# Patient Record
Sex: Female | Born: 1995 | Race: White | Hispanic: No | Marital: Married | Smoking: Never smoker
Health system: Southern US, Community
[De-identification: ages and names within clinical notes are randomized; demographics above are authoritative.]

## PROBLEM LIST (undated history)

## (undated) DIAGNOSIS — R079 Chest pain, unspecified: Secondary | ICD-10-CM

## (undated) DIAGNOSIS — R51 Headache: Secondary | ICD-10-CM

## (undated) DIAGNOSIS — R519 Headache, unspecified: Secondary | ICD-10-CM

## (undated) DIAGNOSIS — F32A Depression, unspecified: Secondary | ICD-10-CM

## (undated) DIAGNOSIS — F419 Anxiety disorder, unspecified: Secondary | ICD-10-CM

## (undated) DIAGNOSIS — F329 Major depressive disorder, single episode, unspecified: Secondary | ICD-10-CM

## (undated) DIAGNOSIS — Z8669 Personal history of other diseases of the nervous system and sense organs: Secondary | ICD-10-CM

## (undated) HISTORY — DX: Depression, unspecified: F32.A

## (undated) HISTORY — DX: Headache: R51

## (undated) HISTORY — DX: Chest pain, unspecified: R07.9

## (undated) HISTORY — DX: Anxiety disorder, unspecified: F41.9

## (undated) HISTORY — DX: Personal history of other diseases of the nervous system and sense organs: Z86.69

## (undated) HISTORY — DX: Major depressive disorder, single episode, unspecified: F32.9

## (undated) HISTORY — DX: Headache, unspecified: R51.9

---
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
## 2015-09-29 ENCOUNTER — Ambulatory Visit: Payer: Self-pay | Admitting: Internal Medicine

## 2015-11-01 ENCOUNTER — Ambulatory Visit (INDEPENDENT_AMBULATORY_CARE_PROVIDER_SITE_OTHER): Payer: BLUE CROSS/BLUE SHIELD | Admitting: Internal Medicine

## 2015-11-01 ENCOUNTER — Encounter: Payer: Self-pay | Admitting: Internal Medicine

## 2015-11-01 VITALS — BP 100/80 | HR 75 | Temp 98.4°F | Resp 18 | Ht <= 58 in | Wt 124.5 lb

## 2015-11-01 DIAGNOSIS — R51 Headache: Secondary | ICD-10-CM

## 2015-11-01 DIAGNOSIS — H9193 Unspecified hearing loss, bilateral: Secondary | ICD-10-CM

## 2015-11-01 DIAGNOSIS — F439 Reaction to severe stress, unspecified: Secondary | ICD-10-CM

## 2015-11-01 DIAGNOSIS — F329 Major depressive disorder, single episode, unspecified: Secondary | ICD-10-CM | POA: Diagnosis not present

## 2015-11-01 DIAGNOSIS — Z658 Other specified problems related to psychosocial circumstances: Secondary | ICD-10-CM

## 2015-11-01 DIAGNOSIS — Z0000 Encounter for general adult medical examination without abnormal findings: Secondary | ICD-10-CM

## 2015-11-01 DIAGNOSIS — R519 Headache, unspecified: Secondary | ICD-10-CM

## 2015-11-01 DIAGNOSIS — F32A Depression, unspecified: Secondary | ICD-10-CM

## 2015-11-01 DIAGNOSIS — R21 Rash and other nonspecific skin eruption: Secondary | ICD-10-CM

## 2015-11-01 MED ORDER — MAGNESIUM OXIDE 400 MG PO TABS: 400.0000 mg | ORAL_TABLET | Freq: Every day | ORAL | Status: DC

## 2015-11-01 NOTE — Progress Notes (Signed)
Pre-visit discussion using our clinic review tool. No additional management support is needed unless otherwise documented below in the visit note.  

## 2015-11-01 NOTE — Progress Notes (Signed)
Patient ID: Kelly Eaton, female   DOB: 08-27-1996, 20 y.o.   MRN: [id number]   Subjective:    Patient ID: Kelly Eaton, female    DOB: Dec 10, 1995, 19 y.o.   MRN: [id number]  HPI  Patient with past history of depression, anxiety and headaches.  She comes in today to establish care.  Previously had been seeing Dr Princess Bruins.  Reports a history of depression and anxiety.  Diagnosed in 2014.  Tool lexapro for 2 years.  Off for one year.  Feels she is doing ok off the medication.  She is in school.  Major - elementary education.  Has three semesters left.  She is planning to get married in 08/2016.  She is a Producer, television/film/video.  Describes a history of cluster headaches.  States brought on by stress.  These mostly involve - posterior bilateral head and move to front - behind her eyes.  She gets headaches approximately three days per week.  Mostly occur at night.  She also has a history of "migraines".  Describes these headaches as all over pressure.  May last all day.  Takes ibuprofen.  These may occur one time per month.  Is seen at [facility] in [place].  Had pap 2014.  Has nexplanon.  No bleeding.  Tries to stay active.  No chest pain or tightness with increased activity or exertion.  No sob.  Does report had back issues 2011.  Went to PT.  She also reports a rash over her back and shoulders.  No itching.  Started in 07/2015.  She has several moles she would like checked.  No nausea or vomiting.  Bowels stable.  Does report some decreased hearing - bilateral.     Past Medical History  Diagnosis Date  . Depression   . Frequent headaches     cluster headaches.  had MRI.   [name] Hx of migraines   . Anxiety    Past Surgical History  Procedure Laterality Date  . Mouth surgery  2013   Family History  Problem Relation Age of Onset  . Heart disease Maternal Grandfather   . Hypertension Maternal Grandfather   . Alcohol abuse Paternal Grandfather   . Hypertension Paternal Grandfather   . Early death  Paternal Grandfather    Social History   Social History  . Marital Status: Single    Spouse Name: N/A  . Number of Children: N/A  . Years of Education: N/A   Social History Main Topics  . Smoking status: Never Smoker   . Smokeless tobacco: Never Used  . Alcohol Use: 0.0 oz/week    0 Standard drinks or equivalent per week  . Drug Use: None  . Sexual Activity: Not Asked   Other Topics Concern  . None   Social History Narrative    Outpatient Encounter Prescriptions as of 11/01/2015  Medication Sig  . magnesium oxide (MAG-OX) 400 MG tablet Take 1 tablet (400 mg total) by mouth daily.   No facility-administered encounter medications on file as of 11/01/2015.    Review of Systems  Constitutional: Negative for fever and appetite change.  HENT: Negative for congestion and sinus pressure.   Eyes: Negative for pain and discharge.  Respiratory: Negative for cough, chest tightness and shortness of breath.   Cardiovascular: Negative for chest pain, palpitations and leg swelling.  Gastrointestinal: Negative for nausea, vomiting, abdominal pain and diarrhea.  Genitourinary: Negative for dysuria and difficulty urinating.  Musculoskeletal: Positive for back pain (has a history  of back pain.  not a significant issue for her now. ). Negative for joint swelling.  Skin: Positive for rash. Negative for color change.  Neurological: Positive for headaches. Negative for dizziness and light-headedness.  Hematological: Negative for adenopathy. Does not bruise/bleed easily.  Psychiatric/Behavioral: Negative for dysphoric mood and agitation.       Objective:    Physical Exam  Constitutional: She appears well-developed and well-nourished. No distress.  HENT:  Nose: Nose normal.  Mouth/Throat: Oropharynx is clear and moist.  Eyes: Conjunctivae are normal. Right eye exhibits no discharge. Left eye exhibits no discharge.  Neck: Neck supple. No thyromegaly present.  Cardiovascular: Normal rate  and regular rhythm.   Pulmonary/Chest: Breath sounds normal. No respiratory distress. She has no wheezes.  Abdominal: Soft. Bowel sounds are normal. There is no tenderness.  Musculoskeletal: She exhibits no edema or tenderness.  Lymphadenopathy:    She has no cervical adenopathy.  Skin: Skin is warm. No erythema.  No significant rash - shoulders.    Psychiatric: She has a normal mood and affect. Her behavior is normal.    BP 100/80 mmHg  Pulse 75  Temp(Src) 98.4 F (36.9 C) (Oral)  Resp 18  Ht  (1.473 m)  Wt 124 lb 8 oz (56.473 kg)  BMI 26.03 kg/m2  SpO2 96%  LMP 10/08/2015 (Exact Date) Wt Readings from Last 3 Encounters:  11/01/15 124 lb 8 oz (56.473 kg)     Lab Results  Component Value Date   WBC 6.3 11/15/2015   HGB 14.8 11/15/2015   HCT 43.2 11/15/2015   PLT 267.0 11/15/2015   GLUCOSE 89 11/15/2015   CHOL 146 11/15/2015   TRIG 79.0 11/15/2015   HDL 54.90 11/15/2015   LDLCALC 76 11/15/2015   ALT 15 11/15/2015   AST 18 11/15/2015   NA 140 11/15/2015   K 4.1 11/15/2015   CL 106 11/15/2015   CREATININE 1.02 11/15/2015   BUN 12 11/15/2015   CO2 25 11/15/2015   TSH 1.23 11/15/2015       Assessment & Plan:   Problem List Items Addressed This Visit    Depression    Has a history of depression.  Previously on lexapro.  Currently doing well.  Follow.  Does not feel needs any further intervention.        Headache - Primary    Has a history of cluster headaches and migraine headaches.  Migraines occur one time per month.  Had nexplanon for birth control.  Discussed sleep, stress, etc.  Feels she is handling stress relatively well.  Has had MRI previously.   States the cluster headaches may occur three days per week.  Will have neurology evaluate.  Start magoxide.  Follow.        Relevant Orders   Ambulatory referral to Neurology   Health care maintenance    Gets her pelvic and pap smears at [facility] in [place].        Hearing loss    States  she has noticed some change in both ears.  We discussed ENT evaluation.  She will notify me when agreeable.        Rash    Described a rash over her shoulders.  No significant rash noted today.  Has several moles she would like checked.  Will have dermatology evaluate.        Relevant Orders   Ambulatory referral to Dermatology   Stress    Increased stress.  Feels she is handling things  relatively well.  Follow.          I spent 45 minutes with the patient and more than 50% of the time was spent in consultation regarding the above.     Dale Segundo, MD

## 2015-11-06 ENCOUNTER — Encounter: Payer: Self-pay | Admitting: Internal Medicine

## 2015-11-15 ENCOUNTER — Other Ambulatory Visit (INDEPENDENT_AMBULATORY_CARE_PROVIDER_SITE_OTHER): Payer: BLUE CROSS/BLUE SHIELD

## 2015-11-15 ENCOUNTER — Telehealth: Payer: Self-pay | Admitting: *Deleted

## 2015-11-15 DIAGNOSIS — F32A Depression, unspecified: Secondary | ICD-10-CM

## 2015-11-15 DIAGNOSIS — Z658 Other specified problems related to psychosocial circumstances: Secondary | ICD-10-CM

## 2015-11-15 DIAGNOSIS — H9193 Unspecified hearing loss, bilateral: Secondary | ICD-10-CM

## 2015-11-15 DIAGNOSIS — R51 Headache: Secondary | ICD-10-CM

## 2015-11-15 DIAGNOSIS — Z13220 Encounter for screening for lipoid disorders: Secondary | ICD-10-CM

## 2015-11-15 DIAGNOSIS — F329 Major depressive disorder, single episode, unspecified: Secondary | ICD-10-CM | POA: Insufficient documentation

## 2015-11-15 DIAGNOSIS — R519 Headache, unspecified: Secondary | ICD-10-CM | POA: Insufficient documentation

## 2015-11-15 DIAGNOSIS — F439 Reaction to severe stress, unspecified: Secondary | ICD-10-CM | POA: Insufficient documentation

## 2015-11-15 DIAGNOSIS — H9190 Unspecified hearing loss, unspecified ear: Secondary | ICD-10-CM | POA: Insufficient documentation

## 2015-11-15 LAB — COMPREHENSIVE METABOLIC PANEL
ALT: 15 U/L (ref 0–35)
AST: 18 U/L (ref 0–37)
Albumin: 4.6 g/dL (ref 3.5–5.2)
Alkaline Phosphatase: 88 U/L (ref 39–117)
BILIRUBIN TOTAL: 0.7 mg/dL (ref 0.2–1.2)
BUN: 12 mg/dL (ref 6–23)
CHLORIDE: 106 meq/L (ref 96–112)
CO2: 25 mEq/L (ref 19–32)
Calcium: 9.7 mg/dL (ref 8.4–10.5)
Creatinine, Ser: 1.02 mg/dL (ref 0.40–1.20)
GFR: 72.8 mL/min (ref >60.00)
Glucose, Bld: 89 mg/dL (ref 70–99)
Potassium: 4.1 mEq/L (ref 3.5–5.1)
Sodium: 140 mEq/L (ref 135–145)
Total Protein: 7.6 g/dL (ref 6.0–8.3)

## 2015-11-15 LAB — CBC WITH DIFFERENTIAL/PLATELET
BASOS PCT: 0.4 % (ref 0.0–3.0)
Basophils Absolute: 0 10*3/uL (ref 0.0–0.1)
EOS PCT: 2.1 % (ref 0.0–5.0)
Eosinophils Absolute: 0.1 10*3/uL (ref 0.0–0.7)
HCT: 43.2 % (ref 36.0–46.0)
Hemoglobin: 14.8 g/dL (ref 12.0–15.0)
LYMPHS ABS: 2.1 10*3/uL (ref 0.7–4.0)
Lymphocytes Relative: 32.9 % (ref 12.0–46.0)
MCHC: 34.2 g/dL (ref 30.0–36.0)
MCV: 92.6 fl (ref 78.0–100.0)
Monocytes Absolute: 0.3 10*3/uL (ref 0.1–1.0)
Monocytes Relative: 4.7 % (ref 3.0–12.0)
NEUTROS ABS: 3.8 10*3/uL (ref 1.4–7.7)
NEUTROS PCT: 59.9 % (ref 43.0–77.0)
PLATELETS: 267 10*3/uL (ref 150.0–400.0)
RBC: 4.67 Mil/uL (ref 3.87–5.11)
RDW: 12.5 % (ref 11.5–14.6)
WBC: 6.3 10*3/uL (ref 4.5–10.5)

## 2015-11-15 LAB — LIPID PANEL
CHOLESTEROL: 146 mg/dL (ref 0–200)
HDL: 54.9 mg/dL (ref >39.00)
LDL Cholesterol: 76 mg/dL (ref 0–99)
NonHDL: 91.38
Total CHOL/HDL Ratio: 3
Triglycerides: 79 mg/dL (ref 0.0–149.0)
VLDL: 15.8 mg/dL (ref 0.0–40.0)

## 2015-11-15 LAB — TSH: TSH: 1.23 u[IU]/mL (ref 0.35–5.50)

## 2015-11-15 NOTE — Telephone Encounter (Signed)
Pt coming in for fasting labs this morning. Kelly Eaton needs orders placed. Pt has only been seen once (11/01/15)

## 2015-11-15 NOTE — Telephone Encounter (Signed)
Thanks

## 2015-11-15 NOTE — Telephone Encounter (Signed)
Orders placed for labs

## 2015-11-16 ENCOUNTER — Encounter: Payer: Self-pay | Admitting: *Deleted

## 2015-11-21 ENCOUNTER — Encounter: Payer: Self-pay | Admitting: Internal Medicine

## 2015-11-21 DIAGNOSIS — R21 Rash and other nonspecific skin eruption: Secondary | ICD-10-CM | POA: Insufficient documentation

## 2015-11-21 DIAGNOSIS — Z0000 Encounter for general adult medical examination without abnormal findings: Secondary | ICD-10-CM | POA: Insufficient documentation

## 2015-11-21 NOTE — Assessment & Plan Note (Signed)
Has a history of cluster headaches and migraine headaches.  Migraines occur one time per month.  Had nexplanon for birth control.  Discussed sleep, stress, etc.  Feels she is handling stress relatively well.  Has had MRI previously.   States the cluster headaches may occur three days per week.  Will have neurology evaluate.  Start magoxide.  Follow.

## 2015-11-21 NOTE — Assessment & Plan Note (Signed)
Described a rash over her shoulders.  No significant rash noted today.  Has several moles she would like checked.  Will have dermatology evaluate.

## 2015-11-21 NOTE — Assessment & Plan Note (Signed)
Has a history of depression.  Previously on lexapro.  Currently doing well.  Follow.  Does not feel needs any further intervention.

## 2015-11-21 NOTE — Assessment & Plan Note (Signed)
States she has noticed some change in both ears.  We discussed ENT evaluation.  She will notify me when agreeable.

## 2015-11-21 NOTE — Assessment & Plan Note (Signed)
Gets her pelvic and pap smears at [facility] in [place].

## 2015-11-21 NOTE — Assessment & Plan Note (Signed)
Increased stress.  Feels she is handling things relatively well.  Follow.   

## 2016-02-07 ENCOUNTER — Encounter: Payer: BLUE CROSS/BLUE SHIELD | Admitting: Internal Medicine

## 2016-03-13 ENCOUNTER — Ambulatory Visit (INDEPENDENT_AMBULATORY_CARE_PROVIDER_SITE_OTHER): Payer: BLUE CROSS/BLUE SHIELD | Admitting: Internal Medicine

## 2016-03-13 ENCOUNTER — Encounter: Payer: Self-pay | Admitting: Internal Medicine

## 2016-03-13 ENCOUNTER — Other Ambulatory Visit (HOSPITAL_COMMUNITY)
Admission: RE | Admit: 2016-03-13 | Discharge: 2016-03-13 | Disposition: A | Discharge status: Home / Self Care | Payer: BLUE CROSS/BLUE SHIELD | Source: Ambulatory Visit | Attending: Internal Medicine | Admitting: Internal Medicine

## 2016-03-13 VITALS — BP 100/70 | HR 59 | Temp 98.5°F | Resp 18 | Ht 59.0 in | Wt 130.5 lb

## 2016-03-13 DIAGNOSIS — Z0000 Encounter for general adult medical examination without abnormal findings: Secondary | ICD-10-CM

## 2016-03-13 DIAGNOSIS — Z01419 Encounter for gynecological examination (general) (routine) without abnormal findings: Secondary | ICD-10-CM | POA: Insufficient documentation

## 2016-03-13 DIAGNOSIS — R51 Headache: Secondary | ICD-10-CM | POA: Diagnosis not present

## 2016-03-13 DIAGNOSIS — F329 Major depressive disorder, single episode, unspecified: Secondary | ICD-10-CM

## 2016-03-13 DIAGNOSIS — F439 Reaction to severe stress, unspecified: Secondary | ICD-10-CM

## 2016-03-13 DIAGNOSIS — F32A Depression, unspecified: Secondary | ICD-10-CM

## 2016-03-13 DIAGNOSIS — Z124 Encounter for screening for malignant neoplasm of cervix: Secondary | ICD-10-CM

## 2016-03-13 DIAGNOSIS — Z658 Other specified problems related to psychosocial circumstances: Secondary | ICD-10-CM

## 2016-03-13 DIAGNOSIS — R519 Headache, unspecified: Secondary | ICD-10-CM

## 2016-03-13 MED ORDER — MUPIROCIN 2 % EX OINT: TOPICAL_OINTMENT | CUTANEOUS | Status: DC

## 2016-03-13 NOTE — Progress Notes (Signed)
Patient ID: Kelly Eaton, female   DOB: 07/25/1996, 20 y.o.   MRN: [id number]   Subjective:    Patient ID: Kelly Eaton, female    DOB: 01/26/1996, 20 y.o.   MRN: [id number]  HPI  Patient here for her physical exam.  Headaches better.  Saw neurology.  Started mangesium.  Off now.  She has decreased sugar intake.  Feels increased sugar aggravates headaches.  Breathing stable.  No acid reflux.  Has nexplanon.  Period - last 12/2015.  No nausea or vomiting.  Bowels stable.  Increased stress.  Feels overwhelmed.  Going to school.  Working two jobs.  Planning a wedding.  She is also concerned about not being able to focus and decreased concentration.  She was questioning ADHD.  Discussed psych referral.  She agreed.  No suicidal ideations.     Past Medical History  Diagnosis Date  . Depression   . Frequent headaches     cluster headaches.  had MRI.   [name]. Hx of migraines   . Anxiety    Past Surgical History  Procedure Laterality Date  . Mouth surgery  2013   Family History  Problem Relation Age of Onset  . Heart disease Maternal Grandfather   . Hypertension Maternal Grandfather   . Alcohol abuse Paternal Grandfather   . Hypertension Paternal Grandfather   . Early death Paternal Grandfather    Social History   Social History  . Marital Status: Single    Spouse Name: N/A  . Number of Children: N/A  . Years of Education: N/A   Social History Main Topics  . Smoking status: Never Smoker   . Smokeless tobacco: Never Used  . Alcohol Use: 0.0 oz/week    0 Standard drinks or equivalent per week  . Drug Use: None  . Sexual Activity: Not Asked   Other Topics Concern  . None   Social History Narrative    Outpatient Encounter Prescriptions as of 03/13/2016  Medication Sig  . [DISCONTINUED] magnesium oxide (MAG-OX) 400 MG tablet Take 1 tablet (400 mg total) by mouth daily.  . mupirocin ointment (BACTROBAN) 2 % Apply to affected area bid   No facility-administered encounter  medications on file as of 03/13/2016.    Review of Systems  Constitutional: Negative for fever and appetite change.  HENT: Negative for congestion and sinus pressure.   Eyes: Negative for pain and visual disturbance.  Respiratory: Negative for cough, chest tightness and shortness of breath.   Cardiovascular: Negative for chest pain, palpitations and leg swelling.  Gastrointestinal: Negative for nausea, vomiting, abdominal pain and diarrhea.  Genitourinary: Negative for dysuria and difficulty urinating.  Musculoskeletal: Negative for back pain and joint swelling.  Skin: Negative for color change and rash.  Neurological: Negative for dizziness.       Headaches improved.   Hematological: Negative for adenopathy. Does not bruise/bleed easily.  Psychiatric/Behavioral: Negative for dysphoric mood and agitation.       Objective:    Physical Exam  Constitutional: She is oriented to person, place, and time. She appears well-developed and well-nourished. No distress.  HENT:  Nose: Nose normal.  Mouth/Throat: Oropharynx is clear and moist.  Eyes: Right eye exhibits no discharge. Left eye exhibits no discharge. No scleral icterus.  Neck: Neck supple. No thyromegaly present.  Cardiovascular: Normal rate and regular rhythm.   Pulmonary/Chest: Breath sounds normal. No accessory muscle usage. No tachypnea. No respiratory distress. She has no decreased breath sounds. She has no wheezes. She has  no rhonchi. Right breast exhibits no inverted nipple, no mass, no nipple discharge and no tenderness (no axillary adenopathy). Left breast exhibits no inverted nipple, no mass, no nipple discharge and no tenderness (no axilarry adenopathy).  Abdominal: Soft. Bowel sounds are normal. There is no tenderness.  Genitourinary:  Normal external genitalia.  Vaginal vault without lesions.  Cervix identified.  Pap smear performed.  Could not appreciate any adnexal masses or tenderness.    Musculoskeletal: She exhibits  no edema or tenderness.  Lymphadenopathy:    She has no cervical adenopathy.  Neurological: She is alert and oriented to person, place, and time.  Skin: Skin is warm. No rash noted. No erythema.  Psychiatric: She has a normal mood and affect. Her behavior is normal.    BP 100/70 mmHg  Pulse 59  Temp(Src) 98.5 F (36.9 C) (Oral)  Resp 18  Ht  (1.499 m)  Wt 130 lb 8 oz (59.194 kg)  BMI 26.34 kg/m2  SpO2 98%  LMP 03/08/2016 (Exact Date) Wt Readings from Last 3 Encounters:  03/13/16 130 lb 8 oz (59.194 kg)  11/01/15 124 lb 8 oz (56.473 kg)     Lab Results  Component Value Date   WBC 6.3 11/15/2015   HGB 14.8 11/15/2015   HCT 43.2 11/15/2015   PLT 267.0 11/15/2015   GLUCOSE 89 11/15/2015   CHOL 146 11/15/2015   TRIG 79.0 11/15/2015   HDL 54.90 11/15/2015   LDLCALC 76 11/15/2015   ALT 15 11/15/2015   AST 18 11/15/2015   NA 140 11/15/2015   K 4.1 11/15/2015   CL 106 11/15/2015   CREATININE 1.02 11/15/2015   BUN 12 11/15/2015   CO2 25 11/15/2015   TSH 1.23 11/15/2015       Assessment & Plan:   Problem List Items Addressed This Visit    Depression    Has a history of depression.  Previously on lexapro.  Overwhelmed now.  No suicidal ideations.  Discussed with her today.  Desires not to start medication.  Was questioning ADHD.  Discussed psych referral.  She agrees.        Relevant Orders   Ambulatory referral to Psychiatry   Headache    Saw neurology.  Took magnesium.  Headaches better.  Off magnesium.  Decreased sugar intake in diet.  Follow.       Health care maintenance    Physical today 03/13/16.  PAP 03/13/16.        Stress    Increased stress as outlined.  See above.  Refer to psych.        Relevant Orders   Ambulatory referral to Psychiatry    Other Visit Diagnoses    Pap smear for cervical cancer screening    -  Primary    Relevant Orders    Cytology - PAP        Dale Providence, MD

## 2016-03-13 NOTE — Progress Notes (Signed)
Pre-visit discussion using our clinic review tool. No additional management support is needed unless otherwise documented below in the visit note.  

## 2016-03-14 ENCOUNTER — Encounter: Payer: Self-pay | Admitting: Internal Medicine

## 2016-03-14 LAB — CYTOLOGY - PAP

## 2016-03-14 NOTE — Assessment & Plan Note (Signed)
Has a history of depression.  Previously on lexapro.  Overwhelmed now.  No suicidal ideations.  Discussed with her today.  Desires not to start medication.  Was questioning ADHD.  Discussed psych referral.  She agrees.

## 2016-03-14 NOTE — Assessment & Plan Note (Signed)
Physical today 03/13/16.  PAP 03/13/16.

## 2016-03-14 NOTE — Assessment & Plan Note (Signed)
Saw neurology.  Took magnesium.  Headaches better.  Off magnesium.  Decreased sugar intake in diet.  Follow.

## 2016-03-14 NOTE — Assessment & Plan Note (Signed)
Increased stress as outlined.  See above.  Refer to psych.

## 2016-03-15 ENCOUNTER — Encounter: Payer: Self-pay | Admitting: *Deleted

## 2016-06-19 ENCOUNTER — Encounter: Payer: Self-pay | Admitting: Internal Medicine

## 2016-06-19 ENCOUNTER — Ambulatory Visit (INDEPENDENT_AMBULATORY_CARE_PROVIDER_SITE_OTHER): Payer: BLUE CROSS/BLUE SHIELD | Admitting: Internal Medicine

## 2016-06-19 VITALS — BP 102/64 | HR 56 | Temp 98.3°F | Ht 59.0 in | Wt 131.2 lb

## 2016-06-19 DIAGNOSIS — F329 Major depressive disorder, single episode, unspecified: Secondary | ICD-10-CM

## 2016-06-19 DIAGNOSIS — R110 Nausea: Secondary | ICD-10-CM | POA: Diagnosis not present

## 2016-06-19 DIAGNOSIS — Z658 Other specified problems related to psychosocial circumstances: Secondary | ICD-10-CM | POA: Diagnosis not present

## 2016-06-19 DIAGNOSIS — R109 Unspecified abdominal pain: Secondary | ICD-10-CM | POA: Diagnosis not present

## 2016-06-19 DIAGNOSIS — L989 Disorder of the skin and subcutaneous tissue, unspecified: Secondary | ICD-10-CM

## 2016-06-19 DIAGNOSIS — F439 Reaction to severe stress, unspecified: Secondary | ICD-10-CM

## 2016-06-19 DIAGNOSIS — Z975 Presence of (intrauterine) contraceptive device: Secondary | ICD-10-CM

## 2016-06-19 DIAGNOSIS — F32A Depression, unspecified: Secondary | ICD-10-CM

## 2016-06-19 LAB — CBC WITH DIFFERENTIAL/PLATELET
Basophils Absolute: 0 10*3/uL (ref 0.0–0.1)
Basophils Relative: 0.4 % (ref 0.0–3.0)
EOS ABS: 0.1 10*3/uL (ref 0.0–0.7)
EOS PCT: 1.2 % (ref 0.0–5.0)
HCT: 40.7 % (ref 36.0–46.0)
HEMOGLOBIN: 14.1 g/dL (ref 12.0–15.0)
LYMPHS ABS: 1.8 10*3/uL (ref 0.7–4.0)
Lymphocytes Relative: 23.8 % (ref 12.0–46.0)
MCHC: 34.5 g/dL (ref 30.0–36.0)
MCV: 93.8 fl (ref 78.0–100.0)
MONO ABS: 0.3 10*3/uL (ref 0.1–1.0)
Monocytes Relative: 4.6 % (ref 3.0–12.0)
NEUTROS PCT: 70 % (ref 43.0–77.0)
Neutro Abs: 5.3 10*3/uL (ref 1.4–7.7)
Platelets: 256 10*3/uL (ref 150.0–400.0)
RBC: 4.34 Mil/uL (ref 3.87–5.11)
RDW: 11.9 % (ref 11.5–15.5)
WBC: 7.5 10*3/uL (ref 4.0–10.5)

## 2016-06-19 LAB — HEPATIC FUNCTION PANEL
ALT: 13 U/L (ref 0–35)
AST: 16 U/L (ref 0–37)
Albumin: 4.2 g/dL (ref 3.5–5.2)
Alkaline Phosphatase: 76 U/L (ref 39–117)
BILIRUBIN DIRECT: 0.1 mg/dL (ref 0.0–0.3)
BILIRUBIN TOTAL: 0.7 mg/dL (ref 0.2–1.2)
Total Protein: 7.2 g/dL (ref 6.0–8.3)

## 2016-06-19 LAB — HCG, QUANTITATIVE, PREGNANCY: QUANTITATIVE HCG: 0.18 m[IU]/mL

## 2016-06-19 LAB — BASIC METABOLIC PANEL
BUN: 22 mg/dL (ref 6–23)
CO2: 27 meq/L (ref 19–32)
Calcium: 9.1 mg/dL (ref 8.4–10.5)
Chloride: 105 mEq/L (ref 96–112)
Creatinine, Ser: 1.01 mg/dL (ref 0.40–1.20)
GFR: 73.21 mL/min (ref >60.00)
GLUCOSE: 79 mg/dL (ref 70–99)
POTASSIUM: 3.9 meq/L (ref 3.5–5.1)
SODIUM: 139 meq/L (ref 135–145)

## 2016-06-19 NOTE — Progress Notes (Signed)
Pre visit review using our clinic review tool, if applicable. No additional management support is needed unless otherwise documented below in the visit note. 

## 2016-06-19 NOTE — Progress Notes (Signed)
Patient ID: Kelly Eaton, female   DOB: 04/04/1996, 20 y.o.   MRN: [id number]   Subjective:    Patient ID: Kelly Eaton, female    DOB: 01/22/1996, 20 y.o.   MRN: [id number]  HPI  Patient here for a scheduled follow up.  She reports stomach cramping - intermittent.  Present for one month. Some nausea in the morning.  Increased this am.  Waves of nausea intermittently.  Some associated light headedness.  No headache.  Eating and drinking.  Some intermittent spotting.  Has nexplanon.  Reports increased arm pain and wants nexplanon removed.   Is concerned may be pregnant.  Some increased stress related to this.    Past Medical History:  Diagnosis Date  . Anxiety   . Depression   . Frequent headaches    cluster headaches.  had MRI.   [name]. Hx of migraines    Past Surgical History:  Procedure Laterality Date  . MOUTH SURGERY  2013   Family History  Problem Relation Age of Onset  . Heart disease Maternal Grandfather   . Hypertension Maternal Grandfather   . Alcohol abuse Paternal Grandfather   . Hypertension Paternal Grandfather   . Early death Paternal Grandfather    Social History   Social History  . Marital status: Single    Spouse name: N/A  . Number of children: N/A  . Years of education: N/A   Social History Main Topics  . Smoking status: Never Smoker  . Smokeless tobacco: Never Used  . Alcohol use 0.0 oz/week  . Drug use: Unknown  . Sexual activity: Not Asked   Other Topics Concern  . None   Social History Narrative  . None    Outpatient Encounter Prescriptions as of 06/19/2016  Medication Sig  . mupirocin ointment (BACTROBAN) 2 % Apply to affected area bid  . sertraline (ZOLOFT) 50 MG tablet    No facility-administered encounter medications on file as of 06/19/2016.     Review of Systems  Constitutional: Negative for appetite change and unexpected weight change.  HENT: Negative for congestion and sinus pressure.   Respiratory: Negative for cough, chest  tightness and shortness of breath.   Cardiovascular: Negative for chest pain, palpitations and leg swelling.  Gastrointestinal: Positive for nausea. Negative for abdominal pain, diarrhea and vomiting.       Some cramping.    Genitourinary: Negative for difficulty urinating and dysuria.  Musculoskeletal: Negative for back pain and joint swelling.       Left arm pain as outlined.    Skin: Negative for color change and rash.  Neurological: Positive for dizziness. Negative for headaches.  Psychiatric/Behavioral: Negative for agitation and dysphoric mood.       Some increased stress with current symptoms and concern about pregnancy.         Objective:    Physical Exam  BP 102/64   Pulse (!) 56   Temp 98.3 F (36.8 C) (Oral)   Ht 4\' 11"  (1.499 m)   Wt 131 lb 3.2 oz (59.5 kg)   SpO2 98%   BMI 26.50 kg/m  Wt Readings from Last 3 Encounters:  06/19/16 131 lb 3.2 oz (59.5 kg)  03/13/16 130 lb 8 oz (59.2 kg)  11/01/15 124 lb 8 oz (56.5 kg)     Lab Results  Component Value Date   WBC 7.5 06/19/2016   HGB 14.1 06/19/2016   HCT 40.7 06/19/2016   PLT 256.0 06/19/2016   GLUCOSE 79 06/19/2016  CHOL 146 11/15/2015   TRIG 79.0 11/15/2015   HDL 54.90 11/15/2015   LDLCALC 76 11/15/2015   ALT 13 06/19/2016   AST 16 06/19/2016   NA 139 06/19/2016   K 3.9 06/19/2016   CL 105 06/19/2016   CREATININE 1.01 06/19/2016   BUN 22 06/19/2016   CO2 27 06/19/2016   TSH 1.23 11/15/2015       Assessment & Plan:   Problem List Items Addressed This Visit    Depression    Seeing psychiatry.  Is doing better.  On zoloft.        Relevant Medications   sertraline (ZOLOFT) 50 MG tablet   Nausea without vomiting    Persistent.  Worse in am.  Concern regarding pregnancy.  Check serum pregnancy test.  Eating.  No vomiting.  No significant abdominal pain.  Check labs.  Further w/up pending results.        Relevant Orders   Hepatic function panel (Completed)   B-HCG Quant (Completed)    Stress    Seeing Dr Maryruth Bun and a counselor Marcelino Duster).  On zoloft.  Doing better.  Increased stress with current symptoms.  Follow.         Other Visit Diagnoses    Abdominal cramping    -  Primary   Relevant Orders   CBC with Differential/Platelet (Completed)   Basic metabolic panel (Completed)   Nexplanon in place       reports some persistent arm pain.  wants removed.  refer to gyn for removal.  was previously placed by her pediatrician.     Relevant Orders   Ambulatory referral to Gynecology   Foot lesion       refer to dermatology for removal.     Relevant Orders   Ambulatory referral to Dermatology       Dale Leslie, MD'

## 2016-06-20 ENCOUNTER — Telehealth: Payer: Self-pay | Admitting: *Deleted

## 2016-06-20 NOTE — Telephone Encounter (Signed)
Patient has requested lab results  Pt contact [phone]

## 2016-06-20 NOTE — Telephone Encounter (Signed)
Patient was informed of results.  Patient understood and no questions, comments, or concerns at this time.  

## 2016-06-24 ENCOUNTER — Encounter: Payer: Self-pay | Admitting: Internal Medicine

## 2016-06-24 NOTE — Assessment & Plan Note (Signed)
Persistent.  Worse in am.  Concern regarding pregnancy.  Check serum pregnancy test.  Eating.  No vomiting.  No significant abdominal pain.  Check labs.  Further w/up pending results.

## 2016-06-24 NOTE — Assessment & Plan Note (Signed)
Seeing psychiatry.  Is doing better.  On zoloft.

## 2016-06-24 NOTE — Assessment & Plan Note (Signed)
Seeing Dr Maryruth BunKapur and a counselor Marcelino Duster(Michelle).  On zoloft.  Doing better.  Increased stress with current symptoms.  Follow.

## 2016-10-04 ENCOUNTER — Encounter: Payer: Self-pay | Admitting: Internal Medicine

## 2016-10-04 ENCOUNTER — Ambulatory Visit (INDEPENDENT_AMBULATORY_CARE_PROVIDER_SITE_OTHER): Payer: BLUE CROSS/BLUE SHIELD | Admitting: Internal Medicine

## 2016-10-04 DIAGNOSIS — R635 Abnormal weight gain: Secondary | ICD-10-CM

## 2016-10-04 DIAGNOSIS — F439 Reaction to severe stress, unspecified: Secondary | ICD-10-CM | POA: Diagnosis not present

## 2016-10-04 NOTE — Progress Notes (Signed)
Pre visit review using our clinic review tool, if applicable. No additional management support is needed unless otherwise documented below in the visit note. 

## 2016-10-04 NOTE — Progress Notes (Signed)
Patient ID: Kelly Eaton, female   DOB: 01/18/1996, 21 y.o.   MRN: [id number]   Subjective:    Patient ID: Kelly Eaton, female    DOB: 03/29/1996, 21 y.o.   MRN: [id number]  HPI  Patient here for a scheduled follow up.  She is concerned regarding weight gain.  Discussed diet and exercise.  Information given.  She prefers to meet with nutritionist. Stays active.  Breathing stable.  No acid reflux.  No abdominal pain or cramping.  Bowels stable.  LMP 10/01/16-10/03/16.  Had nexplanon removed.  Not on any ocp's now.  Husband is in [employer].  Will not see him until April.  She prefers to remain off ocp's for now.     Past Medical History:  Diagnosis Date  . Anxiety   . Depression   . Frequent headaches    cluster headaches.  had MRI.   [name]. Hx of migraines    Past Surgical History:  Procedure Laterality Date  . MOUTH SURGERY  2013   Family History  Problem Relation Age of Onset  . Heart disease Maternal Grandfather   . Hypertension Maternal Grandfather   . Alcohol abuse Paternal Grandfather   . Hypertension Paternal Grandfather   . Early death Paternal Grandfather    Social History   Social History  . Marital status: Single    Spouse name: N/A  . Number of children: N/A  . Years of education: N/A   Social History Main Topics  . Smoking status: Never Smoker  . Smokeless tobacco: Never Used  . Alcohol use 0.0 oz/week  . Drug use: Unknown  . Sexual activity: Not Asked   Other Topics Concern  . None   Social History Narrative  . None    Outpatient Encounter Prescriptions as of 10/04/2016  Medication Sig  . sertraline (ZOLOFT) 50 MG tablet   . [DISCONTINUED] mupirocin ointment (BACTROBAN) 2 % Apply to affected area bid   No facility-administered encounter medications on file as of 10/04/2016.     Review of Systems  Constitutional: Negative for appetite change.       Concerned regarding weight gain.    HENT: Negative for congestion and sinus pressure.   Respiratory:  Negative for cough, chest tightness and shortness of breath.   Cardiovascular: Negative for chest pain, palpitations and leg swelling.  Gastrointestinal: Negative for abdominal pain, diarrhea, nausea and vomiting.  Genitourinary: Negative for difficulty urinating and dysuria.  Musculoskeletal: Negative for back pain and joint swelling.  Skin: Negative for color change and rash.  Neurological: Negative for dizziness, light-headedness and headaches.  Psychiatric/Behavioral: Negative for agitation and dysphoric mood.       Objective:    Physical Exam  Constitutional: She appears well-developed and well-nourished. No distress.  HENT:  Nose: Nose normal.  Mouth/Throat: Oropharynx is clear and moist.  Neck: Neck supple. No thyromegaly present.  Cardiovascular: Normal rate and regular rhythm.   Pulmonary/Chest: Breath sounds normal. No respiratory distress. She has no wheezes.  Abdominal: Soft. Bowel sounds are normal. There is no tenderness.  Musculoskeletal: She exhibits no edema or tenderness.  Lymphadenopathy:    She has no cervical adenopathy.  Skin: No rash noted. No erythema.  Psychiatric: She has a normal mood and affect. Her behavior is normal.    BP 118/72   Pulse 75   Temp 98.1 F (36.7 C) (Oral)   Ht 4\' 11"  (1.499 m)   Wt 142 lb (64.4 kg)   LMP 10/03/2016   SpO2  98%   BMI 28.68 kg/m  Wt Readings from Last 3 Encounters:  10/04/16 142 lb (64.4 kg)  06/19/16 131 lb 3.2 oz (59.5 kg)  03/13/16 130 lb 8 oz (59.2 kg)     Lab Results  Component Value Date   WBC 7.5 06/19/2016   HGB 14.1 06/19/2016   HCT 40.7 06/19/2016   PLT 256.0 06/19/2016   GLUCOSE 79 06/19/2016   CHOL 146 11/15/2015   TRIG 79.0 11/15/2015   HDL 54.90 11/15/2015   LDLCALC 76 11/15/2015   ALT 13 06/19/2016   AST 16 06/19/2016   NA 139 06/19/2016   K 3.9 06/19/2016   CL 105 06/19/2016   CREATININE 1.01 06/19/2016   BUN 22 06/19/2016   CO2 27 06/19/2016   TSH 1.23 11/15/2015         Assessment & Plan:   Problem List Items Addressed This Visit    Stress    Has been seeing Dr Maryruth Bun.  On zoloft.  Follow.  Continue f/u with psychiatry.        Weight gain    Discussed with her today.  Information given.  Refer to nutritionist.  Follow.        Relevant Orders   Amb ref to Medical Nutrition Therapy-MNT       Dale Morehouse, MD

## 2016-10-07 ENCOUNTER — Encounter: Payer: Self-pay | Admitting: Internal Medicine

## 2016-10-07 DIAGNOSIS — R635 Abnormal weight gain: Secondary | ICD-10-CM | POA: Insufficient documentation

## 2016-10-07 NOTE — Assessment & Plan Note (Signed)
Has been seeing Dr Maryruth BunKapur.  On zoloft.  Follow.  Continue f/u with psychiatry.

## 2016-10-07 NOTE — Assessment & Plan Note (Signed)
Discussed with her today.  Information given.  Refer to nutritionist.  Follow.

## 2016-10-31 ENCOUNTER — Encounter: Payer: Self-pay | Admitting: Dietician

## 2016-10-31 ENCOUNTER — Encounter: Payer: BLUE CROSS/BLUE SHIELD | Attending: Internal Medicine | Admitting: Dietician

## 2016-10-31 VITALS — Ht <= 58 in | Wt 137.0 lb

## 2016-10-31 DIAGNOSIS — R635 Abnormal weight gain: Secondary | ICD-10-CM

## 2016-10-31 NOTE — Patient Instructions (Signed)
   Use balanced menus provided to improve nutrition in meals.   Include a protein food daily at lunch.   Good plan is to have something to eat every 3-6 hours during the day.

## 2016-10-31 NOTE — Progress Notes (Signed)
Medical Nutrition Therapy: Visit start time: 1630  end time: 1730  Assessment:  Diagnosis: weight gain Past medical history: cluster headaches Psychosocial issues/ stress concerns: anxiety, depression -- taking medication. She reports high stress level.  Preferred learning method:  . No preference indicated  Current weight: 137lbs  Height: 4'10" Medications, supplements: listed in medical record; reviewed with patient  Progress and evaluation: Patient reports weight gain since starting college and has not been able to curtail weight gain, only seems to be getting worse. She feels it's mostly due to poor food choices, busy schedule, limited finances, and picky eating, and some stress/ emotional eating. She reports strong texture issue, particularly with vegetables, therefore she only eats lettuce at this time. She does eat several types of fruit.    Physical activity: elliptical 40 minutes, 3 times a week  Dietary Intake:  Usual eating pattern includes 3 meals and 1-2 snacks per day. Dining out frequency: 4-5 meals per week.  Breakfast: by 7:30am. atkins protein shake; smoothie with frozen fruit, lowfat milk, lowfat yogurt Snack: none Lunch: 10:45am-- vegetable pasta, whole grain pasta; whole grain tortilla with peanut butter; [place] breast slices and cheese. Small portion nuts Snack: none or nuts Supper: 4:30-5pm at work; very hungry. Chick fila sandwich with cheese, fries, cookie.  Snack: 11pm hungry. Chocolate, ice cream Beverages: mostly water, occasional starbucks coffee, occasionally sweet tea at work; rarely diet coke  Nutrition Care Education: Topics covered: weight management, basic nutrition Basic nutrition: basic food groups, appropriate nutrient balance, appropriate meal and snack schedule, general nutrition guidelines    Weight control: determining reasonable weight loss goal, behavioral changes for weight loss-- strategies for portion control, controlling hunger and  appetite; provided guidance for 1400kcal intake for gradual weight loss; discussed healthy meal and snack options.   Nutritional Diagnosis:  Zeb-3.4 Unintentional weight gain As related to excess calories, stress.  As evidenced by patient report.  Intervention: Instruction as noted above.   Set goals with patient input.    No follow-up scheduled, as patient states she will soon be moving out of state.    Encouraged patient to call or email with any questions or concerns.  Education Materials given:  . Food lists/ Planning A Balanced Meal . Sample meal pattern/ menus: Quick and Healthy Meal Ideas . Goals/ instructions  Learner/ who was taught:  . Patient   Level of understanding: [name]. Verbalizes/ demonstrates competency  Demonstrated degree of understanding via:   Teach back Learning barriers: . None  Willingness to learn/ readiness for change: . Eager, change in progress  Monitoring and Evaluation:  Dietary intake, exercise, and body weight      follow up: prn

## 2016-11-07 ENCOUNTER — Encounter: Payer: Self-pay | Admitting: Family Medicine

## 2016-11-07 ENCOUNTER — Ambulatory Visit (INDEPENDENT_AMBULATORY_CARE_PROVIDER_SITE_OTHER): Payer: BLUE CROSS/BLUE SHIELD | Admitting: Family Medicine

## 2016-11-07 VITALS — BP 100/60 | HR 86 | Temp 98.7°F | Wt 133.2 lb

## 2016-11-07 DIAGNOSIS — J069 Acute upper respiratory infection, unspecified: Secondary | ICD-10-CM | POA: Diagnosis not present

## 2016-11-07 DIAGNOSIS — B9789 Other viral agents as the cause of diseases classified elsewhere: Secondary | ICD-10-CM

## 2016-11-07 DIAGNOSIS — R059 Cough, unspecified: Secondary | ICD-10-CM

## 2016-11-07 DIAGNOSIS — R05 Cough: Secondary | ICD-10-CM | POA: Diagnosis not present

## 2016-11-07 LAB — POCT INFLUENZA A/B
Influenza A, POC: NEGATIVE
Influenza B, POC: NEGATIVE

## 2016-11-07 NOTE — Patient Instructions (Signed)
Nice to meet you. You likely have a viral illness. You can take Claritin or Flonase for this. You may also take Tylenol or ibuprofen for any discomfort. If you develop shortness of breath, cough productive of blood, or fevers please seek medical attention medially.

## 2016-11-07 NOTE — Progress Notes (Signed)
Pre visit review using our clinic review tool, if applicable. No additional management support is needed unless otherwise documented below in the visit note. 

## 2016-11-07 NOTE — Assessment & Plan Note (Signed)
Symptoms most consistent with viral upper respiratory infection. Negative rapid flu test. Advised on Claritin and Flonase. Tylenol or ibuprofen for discomfort. Given return precautions.

## 2016-11-07 NOTE — Progress Notes (Signed)
  Marikay AlarEric Shalia Bartko, MD Phone: [phone]  Kelly Eaton is a 21 y.o. female who presents today for same-day visit.  Patient notes 3 days of cough and nasal congestion and sinus congestion. Notes sore throat with postnasal drip. Some rhinorrhea. No fevers. No shortness of breath. Her ears do itch. She did not get a flu shot. She does have family members that are sick as well. She's not tried any medications for this.   ROS see history of present illness  Objective  Physical Exam Vitals:   11/07/16 1424  BP: 100/60  Pulse: 86  Temp: 98.7 F (37.1 C)    BP Readings from Last 3 Encounters:  11/07/16 100/60  10/04/16 118/72  06/19/16 102/64   Wt Readings from Last 3 Encounters:  11/07/16 133 lb 3.2 oz (60.4 kg)  10/31/16 137 lb (62.1 kg)  10/04/16 142 lb (64.4 kg)    Physical Exam  Constitutional: She is well-developed, well-nourished, and in no distress.  HENT:  Head: Normocephalic and atraumatic.  Mild posterior oropharyngeal erythema, no exudates or tonsillar swelling  Eyes: Conjunctivae are normal. Pupils are equal, round, and reactive to light.  Neck: Neck supple.  Cardiovascular: Normal rate, regular rhythm and normal heart sounds.   Pulmonary/Chest: Effort normal and breath sounds normal.  Lymphadenopathy:    She has no cervical adenopathy.  Neurological: She is alert. Gait normal.  Skin: Skin is warm and dry.     Assessment/Plan: Please see individual problem list.  Viral upper respiratory infection Symptoms most consistent with viral upper respiratory infection. Negative rapid flu test. Advised on Claritin and Flonase. Tylenol or ibuprofen for discomfort. Given return precautions.   Orders Placed This Encounter  Procedures  . POCT Influenza A/B    Marikay AlarEric Scotti Kosta, MD [facility] [place]

## 2017-01-01 ENCOUNTER — Ambulatory Visit: Payer: BLUE CROSS/BLUE SHIELD | Admitting: Internal Medicine

## 2017-11-19 DIAGNOSIS — F331 Major depressive disorder, recurrent, moderate: Secondary | ICD-10-CM | POA: Insufficient documentation

## 2017-11-20 DIAGNOSIS — L910 Hypertrophic scar: Secondary | ICD-10-CM | POA: Insufficient documentation

## 2018-02-14 ENCOUNTER — Telehealth: Payer: Self-pay

## 2018-02-14 NOTE — Telephone Encounter (Signed)
Copied from CRM [id number]. Topic: Inquiry >> Feb 14, 2018 11:58 AM Alexander Bergeron B wrote: Reason for CRM: pt called to see if she can get her immunization record faxed over to her, if so fax to [phone]  >> Feb 14, 2018 12:33 PM Landry Mellow wrote: Please call [phone] when faxed

## 2018-02-18 NOTE — Telephone Encounter (Signed)
Lyla Son calling for [facility] to check on tatus of immunization being faxed? [phone]  Patient has moved to [place] and will  Be seeing NP Lynelle Smoke. Please call back to notify if someone from the office will fax or it will go through medical records dept.

## 2018-02-18 NOTE — Telephone Encounter (Signed)
If we have received signed form - can send immunization record.  Should have record from her pediatrician of previous immunizations.

## 2018-02-19 ENCOUNTER — Telehealth: Payer: Self-pay | Admitting: Internal Medicine

## 2018-02-19 NOTE — Telephone Encounter (Signed)
Called [facility] to let them know that patient needed to sign release and have them fax over for [place] to send records

## 2018-02-19 NOTE — Telephone Encounter (Signed)
Copied from CRM [id number]. Topic: Quick Communication - See Telephone Encounter >> Feb 19, 2018  5:46 PM Lorrine Kin, [place] wrote: CRM for notification. See Telephone encounter for: 02/19/18. Felicia with Dr Hermin[facility], and is requesting the patient's shot records. States that they are the patient's PCP office since December 2018. Please advise. Informed Felicia that a ROI may be needed. CB#: [phone]

## 2018-02-20 NOTE — Telephone Encounter (Signed)
Call and let them know that we need a ROI

## 2018-07-01 ENCOUNTER — Ambulatory Visit (INDEPENDENT_AMBULATORY_CARE_PROVIDER_SITE_OTHER): Admitting: Obstetrics & Gynecology

## 2018-07-01 ENCOUNTER — Encounter: Payer: Self-pay | Admitting: Obstetrics & Gynecology

## 2018-07-01 VITALS — BP 104/62 | HR 78 | Ht <= 58 in | Wt 155.0 lb

## 2018-07-01 DIAGNOSIS — Z3046 Encounter for surveillance of implantable subdermal contraceptive: Secondary | ICD-10-CM | POA: Diagnosis not present

## 2018-07-01 DIAGNOSIS — Z3202 Encounter for pregnancy test, result negative: Secondary | ICD-10-CM

## 2018-07-01 DIAGNOSIS — N926 Irregular menstruation, unspecified: Secondary | ICD-10-CM | POA: Diagnosis not present

## 2018-07-01 DIAGNOSIS — N912 Amenorrhea, unspecified: Secondary | ICD-10-CM

## 2018-07-01 LAB — POCT URINE PREGNANCY: PREG TEST UR: NEGATIVE

## 2018-07-01 NOTE — Patient Instructions (Signed)
Etonogestrel implant What is this medicine? ETONOGESTREL (et oh noe JES trel) is a contraceptive (birth control) device. It is used to prevent pregnancy. It can be used for up to 3 years. This medicine may be used for other purposes; ask your health care provider or pharmacist if you have questions. COMMON BRAND NAME(S): Implanon, Nexplanon What should I tell my health care provider before I take this medicine? They need to know if you have any of these conditions: -abnormal vaginal bleeding -blood vessel disease or blood clots -cancer of the breast, cervix, or liver -depression -diabetes -gallbladder disease -headaches -heart disease or recent heart attack -high blood pressure -high cholesterol -kidney disease -liver disease -renal disease -seizures -tobacco smoker -an unusual or allergic reaction to etonogestrel, other hormones, anesthetics or antiseptics, medicines, foods, dyes, or preservatives -pregnant or trying to get pregnant -breast-feeding How should I use this medicine? This device is inserted just under the skin on the inner side of your upper arm by a health care professional. Talk to your pediatrician regarding the use of this medicine in children. Special care may be needed. Overdosage: If you think you have taken too much of this medicine contact a poison control center or emergency room at once. NOTE: This medicine is only for you. Do not share this medicine with others. What if I miss a dose? This does not apply. What may interact with this medicine? Do not take this medicine with any of the following medications: -amprenavir -bosentan -fosamprenavir This medicine may also interact with the following medications: -barbiturate medicines for inducing sleep or treating seizures -certain medicines for fungal infections like ketoconazole and itraconazole -grapefruit juice -griseofulvin -medicines to treat seizures like carbamazepine, felbamate, oxcarbazepine,  phenytoin, topiramate -modafinil -phenylbutazone -rifampin -rufinamide -some medicines to treat HIV infection like atazanavir, indinavir, lopinavir, nelfinavir, tipranavir, ritonavir -St. John's wort This list may not describe all possible interactions. Give your health care provider a list of all the medicines, herbs, non-prescription drugs, or dietary supplements you use. Also tell them if you smoke, drink alcohol, or use illegal drugs. Some items may interact with your medicine. What should I watch for while using this medicine? This product does not protect you against HIV infection (AIDS) or other sexually transmitted diseases. You should be able to feel the implant by pressing your fingertips over the skin where it was inserted. Contact your doctor if you cannot feel the implant, and use a non-hormonal birth control method (such as condoms) until your doctor confirms that the implant is in place. If you feel that the implant may have broken or become bent while in your arm, contact your healthcare provider. What side effects may I notice from receiving this medicine? Side effects that you should report to your doctor or health care professional as soon as possible: -allergic reactions like skin rash, itching or hives, swelling of the face, lips, or tongue -breast lumps -changes in emotions or moods -depressed mood -heavy or prolonged menstrual bleeding -pain, irritation, swelling, or bruising at the insertion site -scar at site of insertion -signs of infection at the insertion site such as fever, and skin redness, pain or discharge -signs of pregnancy -signs and symptoms of a blood clot such as breathing problems; changes in vision; chest pain; severe, sudden headache; pain, swelling, warmth in the leg; trouble speaking; sudden numbness or weakness of the face, arm or leg -signs and symptoms of liver injury like dark yellow or brown urine; general ill feeling or flu-like symptoms;  light-colored   stools; loss of appetite; nausea; right upper belly pain; unusually weak or tired; yellowing of the eyes or skin -unusual vaginal bleeding, discharge -signs and symptoms of a stroke like changes in vision; confusion; trouble speaking or understanding; severe headaches; sudden numbness or weakness of the face, arm or leg; trouble walking; dizziness; loss of balance or coordination Side effects that usually do not require medical attention (report to your doctor or health care professional if they continue or are bothersome): -acne -back pain -breast pain -changes in weight -dizziness -general ill feeling or flu-like symptoms -headache -irregular menstrual bleeding -nausea -sore throat -vaginal irritation or inflammation This list may not describe all possible side effects. Call your doctor for medical advice about side effects. You may report side effects to FDA at 1-800-FDA-1088. Where should I keep my medicine? This drug is given in a hospital or clinic and will not be stored at home. NOTE: This sheet is a summary. It may not cover all possible information. If you have questions about this medicine, talk to your doctor, pharmacist, or health care provider.  2018 Elsevier/Gold Standard (2016-04-05 11:19:22)  

## 2018-07-01 NOTE — Progress Notes (Signed)
HPI:      Kelly Eaton is a 22 y.o. G0P0000 who LMP was Patient's last menstrual period was 04/23/2018 (exact date)., presents today for a problem visit.  She complains of irregular periods w some pain, felt like most recent period had tissue like debris, has not taken home pregnancy test and wonders if she is pregnant.  LMP - late July heavy, August 3 days lighter, September started last week and is heavy and w debris.  No nausea or breast T.  She has used the following for attempts at control: maxi pad and tampon.  Previous evaluation: none. Prior Diagnosis: None. Previous Treatment: none.  She is single partner, contraception - OCP (estrogen/progesterone) and this has been used irregularly; prior Depo did not like, prior Nexplanon she did like.   PMHx: She  has a past medical history of Anxiety, Depression, Frequent headaches, and migraines. Also,  has a past surgical history that includes Mouth surgery (2013)., family history includes Alcohol abuse in her paternal grandfather; Early death in her paternal grandfather; Heart disease in her maternal grandfather; Hypertension in her maternal grandfather and paternal grandfather.,  reports that she has never smoked. She has never used smokeless tobacco. She reports that she drinks alcohol. She reports that she does not use drugs.  She  Current Outpatient Medications:  .  norgestimate-ethinyl estradiol (ORTHO-CYCLEN,SPRINTEC,PREVIFEM) 0.25-35 MG-MCG tablet, Take by mouth., Disp: , Rfl:   Also, has No Known Allergies.  Review of Systems  Constitutional: Negative for chills, fever and malaise/fatigue.  HENT: Negative for congestion, sinus pain and sore throat.   Eyes: Negative for blurred vision and pain.  Respiratory: Negative for cough and wheezing.   Cardiovascular: Negative for chest pain and leg swelling.  Gastrointestinal: Negative for abdominal pain, constipation, diarrhea, heartburn, nausea and vomiting.  Genitourinary: Negative  for dysuria, frequency, hematuria and urgency.  Musculoskeletal: Negative for back pain, joint pain, myalgias and neck pain.  Skin: Negative for itching and rash.  Neurological: Negative for dizziness, tremors and weakness.  Endo/Heme/Allergies: Does not bruise/bleed easily.  Psychiatric/Behavioral: Negative for depression. The patient is not nervous/anxious and does not have insomnia.    Objective: BP 104/62   Pulse 78   Ht 4\' 10"  (1.473 m)   Wt 155 lb (70.3 kg)   LMP 04/23/2018 (Exact Date)   BMI 32.40 kg/m  Physical Exam  Constitutional: She is oriented to person, place, and time. She appears well-developed and well-nourished. No distress.  HENT:  Head: Normocephalic and atraumatic. Head is without laceration.  Right Ear: Hearing normal.  Left Ear: Hearing normal.  Nose: No epistaxis.  No foreign bodies.  Mouth/Throat: Uvula is midline, oropharynx is clear and moist and mucous membranes are normal.  Eyes: Pupils are equal, round, and reactive to light.  Neck: Normal range of motion. Neck supple. No thyromegaly present.  Cardiovascular: Normal rate and regular rhythm. Exam reveals no gallop and no friction rub.  No murmur heard. Pulmonary/Chest: Effort normal and breath sounds normal. No respiratory distress. She has no wheezes. Right breast exhibits no mass, no skin change and no tenderness. Left breast exhibits no mass, no skin change and no tenderness.  Abdominal: Soft. Bowel sounds are normal. She exhibits no distension. There is no tenderness. There is no rebound.  Musculoskeletal: Normal range of motion.  Neurological: She is alert and oriented to person, place, and time. No cranial nerve deficit.  Skin: Skin is warm and dry.  Psychiatric: She has a normal mood and affect.  Judgment normal.  Vitals reviewed.  ASSESSMENT/PLAN:   1. Irregular Cycles Neg preg test today, likely metrorrhagia  2. 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  The risks and benefits of each were reviewed.  The possible side effects including deep venous thrombosis, breast tenderness, fluid retention, mood changes and abnormal vaginal bleeding were discussed.  Combination as well as progesterone-only options, pros and cons counseled.   Nexplanon Insertion  Patient given informed consent, signed copy in the chart, time out was performed. Pregnancy test was NEG. Appropriate time out taken.  Patient's LEFT arm was prepped and draped in the usual sterile fashion.. The ruler used to measure and mark insertion area.  Pt was prepped with betadine swab and then injected with 3 cc of 2% lidocaine with epinephrine. Nexplanon removed form packaging,  Device confirmed in needle, then inserted full length of needle and withdrawn per handbook instructions.  Pt insertion site covered with steri-strip and a bandage.   Minimal blood loss.  Pt tolerated the procedure well.   Annamarie Major, MD, [facility], [facility] 07/01/2018  2:03 PM

## 2020-04-07 ENCOUNTER — Other Ambulatory Visit: Payer: Self-pay

## 2020-04-07 ENCOUNTER — Telehealth (INDEPENDENT_AMBULATORY_CARE_PROVIDER_SITE_OTHER): Admitting: Internal Medicine

## 2020-04-07 ENCOUNTER — Ambulatory Visit: Payer: BLUE CROSS/BLUE SHIELD | Admitting: Nurse Practitioner

## 2020-04-07 DIAGNOSIS — F439 Reaction to severe stress, unspecified: Secondary | ICD-10-CM

## 2020-04-07 DIAGNOSIS — R0789 Other chest pain: Secondary | ICD-10-CM | POA: Diagnosis not present

## 2020-04-07 NOTE — Progress Notes (Signed)
Patient ID: Kelly Eaton, female   DOB: 06-Apr-1996, 24 y.o.   MRN: [id number]   Virtual Visit via video Note  This visit type was conducted due to national recommendations for restrictions regarding the COVID-19 pandemic (e.g. social distancing).  This format is felt to be most appropriate for this patient at this time.  All issues noted in this document were discussed and addressed.  No physical exam was performed (except for noted visual exam findings with Video Visits).   I connected with Idolina Primer by a video enabled telemedicine application and verified that I am speaking with the correct person using two identifiers. Location patient: home Location provider: work  Persons participating in the virtual visit: patient, provider  The limitations, risks, security and privacy concerns of performing an evaluation and management service by video and the availability of in person appointments have been discussed.   It has also been discussed with the patient that there may be a patient responsible charge related to this service. The patient has expressed understanding and has agreed to proceed.   Reason for visit: work in appt.   HPI: Work in appt with concerns regarding arm pain and heavy feeling along with chest soreness.  She has been in [place].  Husband stationed there.  She is home for a month leave.  Reports that two days before, she did elbow stands and did not warm up.  She has done this previously without any problems.  No other new activity.  States has felt like her arm was alseep.  Felt heavy.  Throbbing in her biceps.  Discomfort moved down into her arm and moved into her chest.  Some fluttering in her chest and intermittent tightness in her chest.  Previous emesis.  Felt like going to pass out. Went to ER.  Per her report, EKG ok.  Blood pressure 120/69.  Labs ok except for creatinine 1.04, 124 - glucose, wbc's 14.1.  Diagnosed with panic attacks.  Prescribed lorazepam 1mg .  makes her  sleepy.  Since her ER evaluation still will notice arm soreness and tightness across her chest.  No increased cough or congestion.  No fever.  [employer] and reports no change with this.  No change after eating.  Some acid reflux.  On omeprazole.  Discussed stress and anxiety. She is stressed regarding having to return to and how everything is going to go.  She reports that she was seen in [place] and had holter placed but apparently something messed up with the recording or the interpretation and unable to get results.      ROS: See pertinent positives and negatives per HPI.  Past Medical History:  Diagnosis Date  . Anxiety   . Depression   . Frequent headaches    cluster headaches.  had MRI.   [place] Hx of migraines     Past Surgical History:  Procedure Laterality Date  . MOUTH SURGERY  2013    Family History  Problem Relation Age of Onset  . Heart disease Maternal Grandfather   . Hypertension Maternal Grandfather   . Alcohol abuse Paternal Grandfather   . Hypertension Paternal Grandfather   . Early death Paternal Grandfather     SOCIAL HX: reviewed.    Current Outpatient Medications:  .  LORazepam (ATIVAN) 1 MG tablet, Take 1 mg by mouth 3 (three) times daily as needed for anxiety., Disp: , Rfl:   EXAM:  GENERAL: alert, oriented, appears well and in no acute distress  HEENT: atraumatic, conjunttiva clear, no obvious abnormalities on inspection of external nose and ears  NECK: normal movements of the head and neck  LUNGS: on inspection no signs of respiratory distress, breathing rate appears normal, no obvious gross SOB, gasping or wheezing  CV: no obvious cyanosis  PSYCH/NEURO: pleasant and cooperative, no obvious depression or anxiety, speech and thought processing grossly intact  ASSESSMENT AND PLAN:  Discussed the following assessment and plan:  Stress Increased stress.  Discussed as outlined.  Has lorazepam.  1mg  dose - too much.  decrase to 1/2  tablet as directed (instead of one whole tablet).  Follow.  Cardiac evaluation as outlined.    Chest tightness Chest tightness and arm heaviness as outlined.  Fluttering as described.  Unable to complete work up in . Has lorazepam if needed.  Instructed to decrease dose.  Will have cardiology evaluate with question of need for any further cardiac w/up (especially unable to complete w/up in [place]).      I discussed the assessment and treatment plan with the patient. The patient was provided an opportunity to ask questions and all were answered. The patient agreed with the plan and demonstrated an understanding of the instructions.   The patient was advised to call back or seek an in-person evaluation if the symptoms worsen or if the condition fails to improve as anticipated.   [place], MD

## 2020-04-08 ENCOUNTER — Telehealth: Payer: Self-pay | Admitting: Internal Medicine

## 2020-04-08 NOTE — Telephone Encounter (Signed)
LMTCB. Records received.

## 2020-04-08 NOTE — Telephone Encounter (Signed)
Please confirm pt doing ok.  Still need the records from where she was seen last week.

## 2020-04-08 NOTE — Telephone Encounter (Signed)
Pt wanted to let Dr. Lorin Picket know that she went to [facility] ED last night for chest pain

## 2020-04-08 NOTE — Telephone Encounter (Signed)
Spoke with patient. Confirmed pt doing ok. Chest is still tight but does feel better knowing that everything checked out ok with her heart. No other acute symptoms noted at this time.

## 2020-04-08 NOTE — Telephone Encounter (Signed)
Notes are in care everywhere.

## 2020-04-11 ENCOUNTER — Encounter: Payer: Self-pay | Admitting: Internal Medicine

## 2020-04-11 ENCOUNTER — Encounter: Payer: Self-pay | Admitting: Cardiology

## 2020-04-11 ENCOUNTER — Other Ambulatory Visit: Payer: Self-pay

## 2020-04-11 ENCOUNTER — Telehealth: Payer: Self-pay | Admitting: Internal Medicine

## 2020-04-11 ENCOUNTER — Ambulatory Visit (INDEPENDENT_AMBULATORY_CARE_PROVIDER_SITE_OTHER): Admitting: Cardiology

## 2020-04-11 VITALS — BP 110/70 | HR 74 | Ht <= 58 in | Wt 148.4 lb

## 2020-04-11 DIAGNOSIS — R072 Precordial pain: Secondary | ICD-10-CM | POA: Diagnosis not present

## 2020-04-11 DIAGNOSIS — R079 Chest pain, unspecified: Secondary | ICD-10-CM

## 2020-04-11 DIAGNOSIS — R0789 Other chest pain: Secondary | ICD-10-CM | POA: Insufficient documentation

## 2020-04-11 NOTE — Patient Instructions (Signed)
Medication Instructions:  None Ordered *If you need a refill on your cardiac medications before your next appointment, please call your pharmacy*   Lab Work: None Ordered If you have labs (blood work) drawn today and your tests are completely normal, you will receive your results only by: [name] MyChart Message (if you have MyChart) OR . A paper copy in the mail If you have any lab test that is abnormal or we need to change your treatment, we will call you to review the results.   Testing/Procedures:  1.  Your physician has requested that you have an echocardiogram. Echocardiography is a painless test that uses sound waves to create images of your heart. It provides your doctor with information about the size and shape of your heart and how well your heart's chambers and valves are working. This procedure takes approximately one hour. There are no restrictions for this procedure.  2.  Your physician has requested that you have a lexiscan myoview.     [facility] MYOVIEW  Your caregiver has ordered a Stress Test with nuclear imaging. The purpose of this test is to evaluate the blood supply to your heart muscle. This procedure is referred to as a "Non-Invasive Stress Test." This is because other than having an IV started in your vein, nothing is inserted or "invades" your body. Cardiac stress tests are done to find areas of poor blood flow to the heart by determining the extent of coronary artery disease (CAD). Some patients exercise on a treadmill, which naturally increases the blood flow to your heart, while others who are  unable to walk on a treadmill due to physical limitations have a pharmacologic/chemical stress agent called Lexiscan . This medicine will mimic walking on a treadmill by temporarily increasing your coronary blood flow.   Please note: these test may take anywhere between 2-4 hours to complete    PLEASE REPORT TO [facility] MEDICAL MALL ENTRANCE  THE VOLUNTEERS AT THE FIRST DESK WILL  DIRECT YOU WHERE TO GO  Date of Procedure:_____________________________________  Arrival Time for Procedure:______________________________    PLEASE NOTIFY THE OFFICE AT LEAST 24 HOURS IN ADVANCE IF YOU ARE UNABLE TO KEEP YOUR APPOINTMENT.  [phone] AND  PLEASE NOTIFY NUCLEAR MEDICINE AT [facility] AT LEAST 24 HOURS IN ADVANCE IF YOU ARE UNABLE TO KEEP YOUR APPOINTMENT. [phone]  How to prepare for your Myoview test:  1. Do not eat or drink after midnight 2. No caffeine for 24 hours prior to test 3. No smoking 24 hours prior to test. 4. Your medication may be taken with water.  If your doctor stopped a medication because of this test, do not take that medication. 5. Ladies, please do not wear dresses.  Skirts or pants are appropriate. Please wear a short sleeve shirt. 6. No perfume, cologne or lotion. 7. Wear comfortable walking shoes. No heels!            Follow-Up: At [facility], you and your health needs are our priority.  As part of our continuing mission to provide you with exceptional heart care, we have created designated Provider Care Teams.  These Care Teams include your primary Cardiologist (physician) and Advanced Practice Providers (APPs -  Physician Assistants and Nurse Practitioners) who all work together to provide you with the care you need, when you need it.  We recommend signing up for the patient portal called "MyChart".  Sign up information is provided on this After Visit Summary.  MyChart is used to connect with patients  for Virtual Visits (Telemedicine).  Patients are able to view lab/test results, encounter notes, upcoming appointments, etc.  Non-urgent messages can be sent to your provider as well.   To learn more about what you can do with MyChart, go to [email or web address].    Your next appointment:   Prior to 04/23/20 when patient leaves the country.  The format for your next appointment:   In Person or Virtual phone  Provider:   Debbe Odea, MD   Other Instructions   Echocardiogram An echocardiogram is a procedure that uses painless sound waves (ultrasound) to produce an image of the heart. Images from an echocardiogram can provide important information about:  Signs of coronary artery disease (CAD).  Aneurysm detection. An aneurysm is a weak or damaged part of an artery wall that bulges out from the normal force of blood pumping through the body.  Heart size and shape. Changes in the size or shape of the heart can be associated with certain conditions, including heart failure, aneurysm, and CAD.  Heart muscle function.  Heart valve function.  Signs of a past heart attack.  Fluid buildup around the heart.  Thickening of the heart muscle.  A tumor or infectious growth around the heart valves. Tell a health care provider about:  Any allergies you have.  All medicines you are taking, including vitamins, herbs, eye drops, creams, and over-the-counter medicines.  Any blood disorders you have.  Any surgeries you have had.  Any medical conditions you have.  Whether you are pregnant or may be pregnant. What are the risks? Generally, this is a safe procedure. However, problems may occur, including:  Allergic reaction to dye (contrast) that may be used during the procedure. What happens before the procedure? No specific preparation is needed. You may eat and drink normally. What happens during the procedure?   An IV tube may be inserted into one of your veins.  You may receive contrast through this tube. A contrast is an injection that improves the quality of the pictures from your heart.  A gel will be applied to your chest.  A wand-like tool (transducer) will be moved over your chest. The gel will help to transmit the sound waves from the transducer.  The sound waves will harmlessly bounce off of your heart to allow the heart images to be captured in real-time motion. The images will be  recorded on a computer. The procedure may vary among health care providers and hospitals. What happens after the procedure?  You may return to your normal, everyday life, including diet, activities, and medicines, unless your health care provider tells you not to do that. Summary  An echocardiogram is a procedure that uses painless sound waves (ultrasound) to produce an image of the heart.  Images from an echocardiogram can provide important information about the size and shape of your heart, heart muscle function, heart valve function, and fluid buildup around your heart.  You do not need to do anything to prepare before this procedure. You may eat and drink normally.  After the echocardiogram is completed, you may return to your normal, everyday life, unless your health care provider tells you not to do that. This information is not intended to replace advice given to you by your health care provider. Make sure you discuss any questions you have with your health care provider. Document Revised: 01/08/2019 Document Reviewed: 10/20/2016 Elsevier Patient Education  2020 [employer].

## 2020-04-11 NOTE — Progress Notes (Signed)
Cardiology Office Note:    Date:  04/11/2020   ID:  Kelly Eaton, DOB 08-14-1996, MRN [id number]  PCP:  Dale [place], MD  [facility] Cardiologist:  No primary care provider on file.  [facility] Electrophysiologist:  None   Referring MD: Dale Blanchester, MD   Chief Complaint  Patient presents with  . OTHER    Chest pain with left arm pain/fatigue . Meds reviewed verbally with pt.   Kelly Eaton is a 24 y.o. female who is being seen today for the evaluation of chest pain at the request of Dale Monette, MD.  History of Present Illness:    Kelly Eaton is a 24 y.o. female with a hx of anxiety who presents due to chest pain.  Patient states pain started about 2 weeks ago.  He rates chest discomfort as 4 out of 10, typically last about 30 minutes, not related with exertion.  Symptoms are sometimes associated with left arm pain.  Upon onset 2 weeks ago, she presented to [facility] in [place] where work-up was unrevealing.  Symptoms have been ongoing almost daily since then.  She was seen at [facility] 5 days ago where work-up was also unrevealing.  He was advised she follows up with cardiology.  She denies any history of heart disease.  States having a family history of MI before age 57s in both grand fathers.  She lives in [place] and is planning on returning in about 12 days.  Past Medical History:  Diagnosis Date  . Anxiety   . Depression   . Frequent headaches    cluster headaches.  had MRI.   [name] Hx of migraines     Past Surgical History:  Procedure Laterality Date  . MOUTH SURGERY  2013    Current Medications: Current Meds  Medication Sig  . IBUPROFEN PO Take by mouth as needed.  [name] levocetirizine (XYZAL) 5 MG tablet Take 5 mg by mouth every evening.  [name] omeprazole (PRILOSEC) 40 MG capsule Take 40 mg by mouth daily.     Allergies:   Patient has no known allergies.   Social History   Socioeconomic History  . Marital status: Single    Spouse name: Not on  file  . Number of children: Not on file  . Years of education: Not on file  . Highest education level: Not on file  Occupational History  . Not on file  Tobacco Use  . Smoking status: Never Smoker  . Smokeless tobacco: Never Used  Vaping Use  . Vaping Use: Never used  Substance and Sexual Activity  . Alcohol use: Yes    Alcohol/week: 0.0 standard drinks    Comment: occasional  . Drug use: Never  . Sexual activity: Yes    Birth control/protection: Pill  Other Topics Concern  . Not on file  Social History Narrative  . Not on file   Social Determinants of Health   Financial Resource Strain:   . Difficulty of Paying Living Expenses:   Food Insecurity:   . Worried About Programme researcher, broadcasting/film/video in the Last Year:   . Barista in the Last Year:   Transportation Needs:   . Freight forwarder (Medical):   [name] Lack of Transportation (Non-Medical):   Physical Activity:   . Days of Exercise per Week:   . Minutes of Exercise per Session:   Stress:   . Feeling of Stress :   Social Connections:   . Frequency of  Communication with Friends and Family:   . Frequency of Social Gatherings with Friends and Family:   . Attends Religious Services:   . Active Member of Clubs or Organizations:   . Attends BankerClub or Organization Meetings:   [name]. Marital Status:      Family History: The patient's family history includes Alcohol abuse in her paternal grandfather; Early death in her paternal grandfather; Heart disease in her maternal grandfather; Hypertension in her maternal grandfather and paternal grandfather.  ROS:   Please see the history of present illness.     All other systems reviewed and are negative.  EKGs/Labs/Other Studies Reviewed:    The following studies were reviewed today:   EKG:  EKG is  ordered today.  The ekg ordered today demonstrates normal sinus rhythm, normal ECG  Recent Labs: No results found for requested labs within last 8760 hours.  Recent Lipid Panel      Component Value Date/Time   CHOL 146 11/15/2015 1334   CHOL 139 10/10/2011 0819   TRIG 79.0 11/15/2015 1334   TRIG 74 10/10/2011 0819   HDL 54.90 11/15/2015 1334   HDL 48 10/10/2011 0819   CHOLHDL 3 11/15/2015 1334   VLDL 15.8 11/15/2015 1334   VLDL 15 10/10/2011 0819   LDLCALC 76 11/15/2015 1334   LDLCALC 76 10/10/2011 0819    Physical Exam:    VS:  BP 110/70 (BP Location: Left Arm, Patient Position: Sitting, Cuff Size: Normal)   Pulse 74   Ht 4\' 10"  (1.473 m)   Wt 148 lb 6 oz (67.3 kg)   SpO2 97%   BMI 31.01 kg/m     Wt Readings from Last 3 Encounters:  04/11/20 148 lb 6 oz (67.3 kg)  04/07/20 145 lb (65.8 kg)  07/01/18 155 lb (70.3 kg)     GEN:  Well nourished, well developed in no acute distress HEENT: Normal NECK: No JVD; No carotid bruits LYMPHATICS: No lymphadenopathy CARDIAC: RRR, no murmurs, rubs, gallops RESPIRATORY:  Clear to auscultation without rales, wheezing or rhonchi  ABDOMEN: Soft, non-tender, non-distended MUSCULOSKELETAL:  No edema; No deformity  SKIN: Warm and dry NEUROLOGIC:  Alert and oriented x 3 PSYCHIATRIC:  Normal affect   ASSESSMENT:    1. Chest pain of uncertain etiology   2. Precordial pain    PLAN:    In order of problems listed above:  1. Patient with atypical chest discomfort.  She has no cardiac risk factors and is therefore low cardiac risk.  Symptoms have prompted to ED visits.  Will evaluate patient with echocardiogram and Lexiscan Myoview.  If testing is normal, I believe will go a long way to reassure patient.  Anxiety "possibly contributing.  Follow-up after echo and stress test.  This can be a virtual visit.   Medication Adjustments/Labs and Tests Ordered: Current medicines are reviewed at length with the patient today.  Concerns regarding medicines are outlined above.  Orders Placed This Encounter  Procedures  . NM Myocar Multi W/Spect W/Wall Motion / EF  . EKG 12-Lead  . ECHOCARDIOGRAM COMPLETE   No orders of  the defined types were placed in this encounter.   Patient Instructions  Medication Instructions:  None Ordered *If you need a refill on your cardiac medications before your next appointment, please call your pharmacy*   Lab Work: None Ordered If you have labs (blood work) drawn today and your tests are completely normal, you will receive your results only by: [name]. MyChart Message (if you have MyChart)  OR . A paper copy in the mail If you have any lab test that is abnormal or we need to change your treatment, we will call you to review the results.   Testing/Procedures:  1.  Your physician has requested that you have an echocardiogram. Echocardiography is a painless test that uses sound waves to create images of your heart. It provides your doctor with information about the size and shape of your heart and how well your heart's chambers and valves are working. This procedure takes approximately one hour. There are no restrictions for this procedure.  2.  Your physician has requested that you have a lexiscan myoview.     [facility] MYOVIEW  Your caregiver has ordered a Stress Test with nuclear imaging. The purpose of this test is to evaluate the blood supply to your heart muscle. This procedure is referred to as a "Non-Invasive Stress Test." This is because other than having an IV started in your vein, nothing is inserted or "invades" your body. Cardiac stress tests are done to find areas of poor blood flow to the heart by determining the extent of coronary artery disease (CAD). Some patients exercise on a treadmill, which naturally increases the blood flow to your heart, while others who are  unable to walk on a treadmill due to physical limitations have a pharmacologic/chemical stress agent called Lexiscan . This medicine will mimic walking on a treadmill by temporarily increasing your coronary blood flow.   Please note: these test may take anywhere between 2-4 hours to complete    PLEASE  REPORT TO [facility] MEDICAL MALL ENTRANCE  THE VOLUNTEERS AT THE FIRST DESK WILL DIRECT YOU WHERE TO GO  Date of Procedure:_____________________________________  Arrival Time for Procedure:______________________________    PLEASE NOTIFY THE OFFICE AT LEAST 24 HOURS IN ADVANCE IF YOU ARE UNABLE TO KEEP YOUR APPOINTMENT.  [phone] AND  PLEASE NOTIFY NUCLEAR MEDICINE AT [facility] AT LEAST 24 HOURS IN ADVANCE IF YOU ARE UNABLE TO KEEP YOUR APPOINTMENT. [phone]  How to prepare for your Myoview test:  1. Do not eat or drink after midnight 2. No caffeine for 24 hours prior to test 3. No smoking 24 hours prior to test. 4. Your medication may be taken with water.  If your doctor stopped a medication because of this test, do not take that medication. 5. Ladies, please do not wear dresses.  Skirts or pants are appropriate. Please wear a short sleeve shirt. 6. No perfume, cologne or lotion. 7. Wear comfortable walking shoes. No heels!            Follow-Up: At [facility], you and your health needs are our priority.  As part of our continuing mission to provide you with exceptional heart care, we have created designated Provider Care Teams.  These Care Teams include your primary Cardiologist (physician) and Advanced Practice Providers (APPs -  Physician Assistants and Nurse Practitioners) who all work together to provide you with the care you need, when you need it.  We recommend signing up for the patient portal called "MyChart".  Sign up information is provided on this After Visit Summary.  MyChart is used to connect with patients for Virtual Visits (Telemedicine).  Patients are able to view lab/test results, encounter notes, upcoming appointments, etc.  Non-urgent messages can be sent to your provider as well.   To learn more about what you can do with MyChart, go to [email or web address].    Your next appointment:   Prior to 04/23/20 when patient  leaves the country.  The format for  your next appointment:   In Person or Virtual phone  Provider:   Debbe Odea, MD   Other Instructions   Echocardiogram An echocardiogram is a procedure that uses painless sound waves (ultrasound) to produce an image of the heart. Images from an echocardiogram can provide important information about:  Signs of coronary artery disease (CAD).  Aneurysm detection. An aneurysm is a weak or damaged part of an artery wall that bulges out from the normal force of blood pumping through the body.  Heart size and shape. Changes in the size or shape of the heart can be associated with certain conditions, including heart failure, aneurysm, and CAD.  Heart muscle function.  Heart valve function.  Signs of a past heart attack.  Fluid buildup around the heart.  Thickening of the heart muscle.  A tumor or infectious growth around the heart valves. Tell a health care provider about:  Any allergies you have.  All medicines you are taking, including vitamins, herbs, eye drops, creams, and over-the-counter medicines.  Any blood disorders you have.  Any surgeries you have had.  Any medical conditions you have.  Whether you are pregnant or may be pregnant. What are the risks? Generally, this is a safe procedure. However, problems may occur, including:  Allergic reaction to dye (contrast) that may be used during the procedure. What happens before the procedure? No specific preparation is needed. You may eat and drink normally. What happens during the procedure?   An IV tube may be inserted into one of your veins.  You may receive contrast through this tube. A contrast is an injection that improves the quality of the pictures from your heart.  A gel will be applied to your chest.  A wand-like tool (transducer) will be moved over your chest. The gel will help to transmit the sound waves from the transducer.  The sound waves will harmlessly bounce off of your heart to allow the  heart images to be captured in real-time motion. The images will be recorded on a computer. The procedure may vary among health care providers and hospitals. What happens after the procedure?  You may return to your normal, everyday life, including diet, activities, and medicines, unless your health care provider tells you not to do that. Summary  An echocardiogram is a procedure that uses painless sound waves (ultrasound) to produce an image of the heart.  Images from an echocardiogram can provide important information about the size and shape of your heart, heart muscle function, heart valve function, and fluid buildup around your heart.  You do not need to do anything to prepare before this procedure. You may eat and drink normally.  After the echocardiogram is completed, you may return to your normal, everyday life, unless your health care provider tells you not to do that. This information is not intended to replace advice given to you by your health care provider. Make sure you discuss any questions you have with your health care provider. Document Revised: 01/08/2019 Document Reviewed: 10/20/2016 Elsevier Patient Education  2020 [employer].     Signed, Debbe Odea, MD  04/11/2020 5:47 PM     [facility]

## 2020-04-11 NOTE — Assessment & Plan Note (Addendum)
Increased stress.  Discussed as outlined.  Has lorazepam.  1mg  dose - too much.  decrase to 1/2 tablet as directed (instead of one whole tablet).  Follow.  Cardiac evaluation as outlined.

## 2020-04-11 NOTE — Assessment & Plan Note (Addendum)
Chest tightness and arm heaviness as outlined.  Fluttering as described.  Unable to complete work up in [place]. Has lorazepam if needed.  Instructed to decrease dose.  Will have cardiology evaluate with question of need for any further cardiac w/up (especially unable to complete w/up in [place]).  Obtain outside records to review.

## 2020-04-11 NOTE — Telephone Encounter (Signed)
Tried to reach patient concerning cardiology appointment no answer called back 20 minutes later and then patient called she will keep appointment at 3:20 to day at cardiology.

## 2020-04-13 ENCOUNTER — Ambulatory Visit (INDEPENDENT_AMBULATORY_CARE_PROVIDER_SITE_OTHER)

## 2020-04-13 ENCOUNTER — Telehealth: Payer: Self-pay | Admitting: *Deleted

## 2020-04-13 ENCOUNTER — Other Ambulatory Visit: Payer: Self-pay | Admitting: Internal Medicine

## 2020-04-13 ENCOUNTER — Other Ambulatory Visit: Payer: Self-pay

## 2020-04-13 DIAGNOSIS — R079 Chest pain, unspecified: Secondary | ICD-10-CM

## 2020-04-13 DIAGNOSIS — R7989 Other specified abnormal findings of blood chemistry: Secondary | ICD-10-CM

## 2020-04-13 DIAGNOSIS — R072 Precordial pain: Secondary | ICD-10-CM

## 2020-04-13 DIAGNOSIS — D72829 Elevated white blood cell count, unspecified: Secondary | ICD-10-CM

## 2020-04-13 NOTE — Telephone Encounter (Signed)
-----   Message from Debbe Odea, MD sent at 04/13/2020 11:06 AM EDT ----- Normal systolic and diastolic function, normal echocardiogram

## 2020-04-13 NOTE — Telephone Encounter (Signed)
Reviewed results with patient and she verbalized understanding with no further questions.  

## 2020-04-13 NOTE — Telephone Encounter (Signed)
No answer/Voicemail box is not set up 

## 2020-04-14 ENCOUNTER — Ambulatory Visit
Admission: RE | Admit: 2020-04-14 | Discharge: 2020-04-14 | Disposition: A | Discharge status: Home / Self Care | Source: Ambulatory Visit | Attending: Cardiology | Admitting: Cardiology

## 2020-04-14 ENCOUNTER — Other Ambulatory Visit: Payer: Self-pay

## 2020-04-14 DIAGNOSIS — R072 Precordial pain: Secondary | ICD-10-CM

## 2020-04-14 LAB — NM MYOCAR MULTI W/SPECT W/WALL MOTION / EF
Estimated workload: 1 METS
Exercise duration (min): 0 min
Exercise duration (sec): 0 s
LV dias vol: 53 mL (ref 46–106)
LV sys vol: 20 mL
MPHR: 195 {beats}/min
Peak HR: 122 {beats}/min
Percent HR: 62 %
Rest HR: 64 {beats}/min
SDS: 3
SRS: 1
SSS: 9
TID: 1.02

## 2020-04-14 MED ORDER — TECHNETIUM TC 99M TETROFOSMIN IV KIT
10.0000 | PACK | Freq: Once | INTRAVENOUS | Status: AC | PRN
  Administered 2020-04-14: 10.85 via INTRAVENOUS

## 2020-04-14 MED ORDER — TECHNETIUM TC 99M TETROFOSMIN IV KIT
30.0000 | PACK | Freq: Once | INTRAVENOUS | Status: AC | PRN
  Administered 2020-04-14: 31.68 via INTRAVENOUS

## 2020-04-14 MED ORDER — REGADENOSON 0.4 MG/5ML IV SOLN
0.4000 mg | Freq: Once | INTRAVENOUS | Status: AC
  Administered 2020-04-14: 0.4 mg via INTRAVENOUS

## 2020-04-15 ENCOUNTER — Telehealth: Payer: Self-pay | Admitting: Cardiology

## 2020-04-15 NOTE — Telephone Encounter (Signed)
Patient calling to check on status of results °

## 2020-04-15 NOTE — Telephone Encounter (Signed)
The patient has been notified of the result and verbalized understanding.  All questions (if any) were answered.     

## 2020-04-18 ENCOUNTER — Encounter: Payer: Self-pay | Admitting: Cardiology

## 2020-04-18 ENCOUNTER — Other Ambulatory Visit: Payer: Self-pay

## 2020-04-18 ENCOUNTER — Ambulatory Visit (INDEPENDENT_AMBULATORY_CARE_PROVIDER_SITE_OTHER): Admitting: Cardiology

## 2020-04-18 VITALS — BP 106/60 | HR 60 | Ht <= 58 in | Wt 148.5 lb

## 2020-04-18 DIAGNOSIS — R079 Chest pain, unspecified: Secondary | ICD-10-CM | POA: Diagnosis not present

## 2020-04-18 NOTE — Patient Instructions (Signed)
Medication Instructions:   No Changes  *If you need a refill on your cardiac medications before your next appointment, please call your pharmacy*   Lab Work: None Ordered If you have labs (blood work) drawn today and your tests are completely normal, you will receive your results only by: . MyChart Message (if you have MyChart) OR . A paper copy in the mail If you have any lab test that is abnormal or we need to change your treatment, we will call you to review the results.   Testing/Procedures:  None Ordered   Follow-Up: At [facility], you and your health needs are our priority.  As part of our continuing mission to provide you with exceptional heart care, we have created designated Provider Care Teams.  These Care Teams include your primary Cardiologist (physician) and Advanced Practice Providers (APPs -  Physician Assistants and Nurse Practitioners) who all work together to provide you with the care you need, when you need it.  We recommend signing up for the patient portal called "MyChart".  Sign up information is provided on this After Visit Summary.  MyChart is used to connect with patients for Virtual Visits (Telemedicine).  Patients are able to view lab/test results, encounter notes, upcoming appointments, etc.  Non-urgent messages can be sent to your provider as well.   To learn more about what you can do with MyChart, go to [email or web address].    Your next appointment:   Follow up as needed   The format for your next appointment:   In Person  Provider:   Brian Agbor-Etang, MD   Other Instructions N/A 

## 2020-04-18 NOTE — Progress Notes (Signed)
Cardiology Office Note:    Date:  04/18/2020   ID:  Kelly Eaton, DOB 10-21-1995, MRN [id number]  PCP:  Dale [place], MD  [facility] Cardiologist:  No primary care provider on file.  [facility] Electrophysiologist:  None   Referring MD: Dale Greenfield, MD   Chief Complaint  Patient presents with  . other    Follow up from Echo and Lexiscan Myoview. Meds reviewed by the pt. verbally. Pt. c/o chest pain and shortness of breath.     History of Present Illness:    NAN Kelly Eaton is a 24 y.o. female with a hx of anxiety who presents for follow-up.  She was last seen due to a 2-week history of chest pain.  Symptoms have prompted multiple ED visits in the past.  Has a family history of CAD.  She lives in [place] and wanted work-up prior to leaving.  Echo and Myoview was ordered to evaluate patient's symptoms.  She still states having chest discomfort and shortness of breath.   Past Medical History:  Diagnosis Date  . Anxiety   . Depression   . Frequent headaches    cluster headaches.  had MRI.   [name] Hx of migraines     Past Surgical History:  Procedure Laterality Date  . MOUTH SURGERY  2013    Current Medications: Current Meds  Medication Sig  . IBUPROFEN PO Take by mouth as needed.  [name] levocetirizine (XYZAL) 5 MG tablet Take 5 mg by mouth every evening.  [name] LORazepam (ATIVAN) 1 MG tablet Take 1 mg by mouth 3 (three) times daily as needed for anxiety.   [name] omeprazole (PRILOSEC) 40 MG capsule Take 40 mg by mouth daily.     Allergies:   Oxycodone   Social History   Socioeconomic History  . Marital status: Single    Spouse name: Not on file  . Number of children: Not on file  . Years of education: Not on file  . Highest education level: Not on file  Occupational History  . Not on file  Tobacco Use  . Smoking status: Never Smoker  . Smokeless tobacco: Never Used  Vaping Use  . Vaping Use: Never used  Substance and Sexual Activity  . Alcohol use: Yes     Alcohol/week: 0.0 standard drinks    Comment: occasional  . Drug use: Never  . Sexual activity: Yes    Birth control/protection: Pill  Other Topics Concern  . Not on file  Social History Narrative  . Not on file   Social Determinants of Health   Financial Resource Strain:   . Difficulty of Paying Living Expenses:   Food Insecurity:   . Worried About Programme researcher, broadcasting/film/video in the Last Year:   . Barista in the Last Year:   Transportation Needs:   . Freight forwarder (Medical):   [name] Lack of Transportation (Non-Medical):   Physical Activity:   . Days of Exercise per Week:   . Minutes of Exercise per Session:   Stress:   . Feeling of Stress :   Social Connections:   . Frequency of Communication with Friends and Family:   . Frequency of Social Gatherings with Friends and Family:   . Attends Religious Services:   . Active Member of Clubs or Organizations:   . Attends Banker Meetings:   [name] Marital Status:      Family History: The patient's family history includes Alcohol abuse in her paternal  grandfather; Early death in her paternal grandfather; Heart disease in her maternal grandfather; Hypertension in her maternal grandfather and paternal grandfather.  ROS:   Please see the history of present illness.     All other systems reviewed and are negative.  EKGs/Labs/Other Studies Reviewed:    The following studies were reviewed today:   EKG:  EKG is  ordered today.  The ekg ordered today demonstrates normal sinus rhythm, normal ECG  Recent Labs: No results found for requested labs within last 8760 hours.  Recent Lipid Panel    Component Value Date/Time   CHOL 146 11/15/2015 1334   CHOL 139 10/10/2011 0819   TRIG 79.0 11/15/2015 1334   TRIG 74 10/10/2011 0819   HDL 54.90 11/15/2015 1334   HDL 48 10/10/2011 0819   CHOLHDL 3 11/15/2015 1334   VLDL 15.8 11/15/2015 1334   VLDL 15 10/10/2011 0819   LDLCALC 76 11/15/2015 1334   LDLCALC 76 10/10/2011  0819    Physical Exam:    VS:  BP 106/60 (BP Location: Left Arm, Patient Position: Sitting, Cuff Size: Normal)   Pulse 60   Ht 4\' 10"  (1.473 m)   Wt 148 lb 8 oz (67.4 kg)   LMP 04/06/2020 (Exact Date)   SpO2 98%   BMI 31.04 kg/m     Wt Readings from Last 3 Encounters:  04/18/20 148 lb 8 oz (67.4 kg)  04/11/20 148 lb 6 oz (67.3 kg)  04/07/20 145 lb (65.8 kg)     GEN:  Well nourished, well developed in no acute distress HEENT: Normal NECK: No JVD; No carotid bruits LYMPHATICS: No lymphadenopathy CARDIAC: RRR, no murmurs, rubs, gallops RESPIRATORY:  Clear to auscultation without rales, wheezing or rhonchi  ABDOMEN: Soft, non-tender, non-distended MUSCULOSKELETAL:  No edema; No deformity  SKIN: Warm and dry NEUROLOGIC:  Alert and oriented x 3 PSYCHIATRIC:  Normal affect   ASSESSMENT:    1. Chest pain of uncertain etiology    PLAN:    In order of problems listed above:  1. Patient with atypical chest discomfort.  She has no cardiac risk factors and is therefore low cardiac risk.  Symptoms have prompted to ED visits.  Echocardiogram showed normal systolic and diastolic function, EF 60 to 65%.  Lexiscan Myoview with no evidence for ischemia.  Low risk scan.  Patient reassured.  Anxiety likely contributing to patient's symptoms.  She is young with no cardiac risk factors and noninvasive testing is unrevealing as above.  Patient plans to follow-up with primary care physician regarding anxiety management.  Total encounter time 35 minutes  Greater than 50% was spent in counseling and coordination of care with the patient   Follow-up as needed.   Medication Adjustments/Labs and Tests Ordered: Current medicines are reviewed at length with the patient today.  Concerns regarding medicines are outlined above.  Orders Placed This Encounter  Procedures  . EKG 12-Lead   No orders of the defined types were placed in this encounter.   Patient Instructions  Medication  Instructions:  No Changes *If you need a refill on your cardiac medications before your next appointment, please call your pharmacy*   Lab Work: None Ordered If you have labs (blood work) drawn today and your tests are completely normal, you will receive your results only by: 06/08/20 MyChart Message (if you have MyChart) OR . A paper copy in the mail If you have any lab test that is abnormal or we need to change your treatment, we will call  you to review the results.   Testing/Procedures: None Ordered   Follow-Up: At [facility], you and your health needs are our priority.  As part of our continuing mission to provide you with exceptional heart care, we have created designated Provider Care Teams.  These Care Teams include your primary Cardiologist (physician) and Advanced Practice Providers (APPs -  Physician Assistants and Nurse Practitioners) who all work together to provide you with the care you need, when you need it.  We recommend signing up for the patient portal called "MyChart".  Sign up information is provided on this After Visit Summary.  MyChart is used to connect with patients for Virtual Visits (Telemedicine).  Patients are able to view lab/test results, encounter notes, upcoming appointments, etc.  Non-urgent messages can be sent to your provider as well.   To learn more about what you can do with MyChart, go to [email or web address].    Your next appointment:   Follow up as needed   The format for your next appointment:   In Person  Provider:   Debbe Odea, MD   Other Instructions N/A     Signed, Debbe Odea, MD  04/18/2020 5:32 PM    [facility]

## 2020-04-20 ENCOUNTER — Telehealth: Payer: Self-pay | Admitting: Internal Medicine

## 2020-04-20 NOTE — Telephone Encounter (Signed)
Patient is unable to do a f/u appt with you because she leaves to go back to [place] on Saturday. I have scheduled her for labs on Friday and screened her. Confirmed she is doing ok.

## 2020-04-20 NOTE — Telephone Encounter (Signed)
I want to make sure we can get lab results before she leaves on Saturday.  Not sure if she can come in tomorrow for labs.  Also, confirm she is doing ok.

## 2020-04-20 NOTE — Telephone Encounter (Signed)
Please call pt.  She is here from [place] - [employer] leave.  She has had cardiac w/up.  Does need f/u regarding her previous labs.  I have placed order for here if she is able to come in to office. Will need labs rechecked prior to returning.   Also, confirm if she needs f/u appt with me prior to returning to discuss stress, etc.  Ok to schedule virtual if desires.

## 2020-04-20 NOTE — Telephone Encounter (Signed)
She is doing ok. She is not able to come in earlier that Friday. 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 Leaving this PM.

## 2020-04-21 ENCOUNTER — Telehealth: Payer: Self-pay | Admitting: Internal Medicine

## 2020-04-21 NOTE — Telephone Encounter (Signed)
Pt stated to disregard. She has to leave for [employer] tomorrow morning about 5 am. She is unable to come in for labs as well.

## 2020-04-21 NOTE — Telephone Encounter (Signed)
Pt needs a certificate signed by PCP in order to go back to [place] on Saturday. Pt has lab appt on 04/22/20. Please call back ASAP to clarify

## 2020-04-22 ENCOUNTER — Other Ambulatory Visit

## 2021-07-31 LAB — OB RESULTS CONSOLE PLATELET COUNT: Platelets: 274

## 2021-07-31 LAB — OB RESULTS CONSOLE VARICELLA ZOSTER ANTIBODY, IGG: Varicella: IMMUNE

## 2021-07-31 LAB — OB RESULTS CONSOLE ABO/RH: RH Type: POSITIVE

## 2021-07-31 LAB — OB RESULTS CONSOLE RUBELLA ANTIBODY, IGM: Rubella: IMMUNE

## 2021-07-31 LAB — OB RESULTS CONSOLE HEPATITIS B SURFACE ANTIGEN: Hepatitis B Surface Ag: NEGATIVE

## 2021-07-31 LAB — OB RESULTS CONSOLE GC/CHLAMYDIA
Chlamydia: NEGATIVE
Gonorrhea: NEGATIVE

## 2021-07-31 LAB — OB RESULTS CONSOLE HGB/HCT, BLOOD
HCT: 39 (ref 29–41)
Hemoglobin: 13.1

## 2021-07-31 LAB — OB RESULTS CONSOLE HIV ANTIBODY (ROUTINE TESTING): HIV: NONREACTIVE

## 2021-07-31 LAB — HEPATITIS C ANTIBODY: HCV Ab: NEGATIVE

## 2021-10-31 ENCOUNTER — Ambulatory Visit (INDEPENDENT_AMBULATORY_CARE_PROVIDER_SITE_OTHER): Admitting: Obstetrics and Gynecology

## 2021-10-31 ENCOUNTER — Encounter: Payer: Self-pay | Admitting: Obstetrics and Gynecology

## 2021-10-31 ENCOUNTER — Other Ambulatory Visit: Payer: Self-pay

## 2021-10-31 VITALS — BP 101/70 | HR 94 | Ht <= 58 in | Wt 153.9 lb

## 2021-10-31 DIAGNOSIS — Z01419 Encounter for gynecological examination (general) (routine) without abnormal findings: Secondary | ICD-10-CM

## 2021-10-31 DIAGNOSIS — Z3A20 20 weeks gestation of pregnancy: Secondary | ICD-10-CM | POA: Diagnosis not present

## 2021-10-31 DIAGNOSIS — Z7689 Persons encountering health services in other specified circumstances: Secondary | ICD-10-CM

## 2021-10-31 DIAGNOSIS — Z23 Encounter for immunization: Secondary | ICD-10-CM

## 2021-10-31 DIAGNOSIS — Z3402 Encounter for supervision of normal first pregnancy, second trimester: Secondary | ICD-10-CM | POA: Diagnosis not present

## 2021-10-31 LAB — POCT URINALYSIS DIPSTICK
Bilirubin, UA: NEGATIVE
Blood, UA: NEGATIVE
Glucose, UA: NEGATIVE
Ketones, UA: NEGATIVE
Leukocytes, UA: NEGATIVE
Nitrite, UA: NEGATIVE
Protein, UA: NEGATIVE
Spec Grav, UA: 1.02
Urobilinogen, UA: 0.2 U/dL
pH, UA: 6.5

## 2021-10-31 NOTE — Progress Notes (Signed)
Patient presents for prenatal care. Patient is transferring from the [employer] for care. Patient states she has been experiencing right sided low back pain. Patient states this was a planned pregnancy, experiencing fetal movement with no concerns,

## 2021-10-31 NOTE — Progress Notes (Signed)
HPI:      [name] is a 26 y.o. G1P0000 who LMP was Patient's last menstrual period was 06/16/2021 (approximate).  Subjective:   She presents today at 20 weeks and 5 days estimated gestational age.  This is her first pregnancy.  This was a planned pregnancy.  She has received prenatal care in [place] up until this point.  She has not yet had her anatomy scan.  She is feeling daily fetal movement.  She reports that she has had genetic testing which was normal and showed a female baby. She states that she is taking Lexapro because she has mood issues.    Hx: The following portions of the patient's history were reviewed and updated as appropriate:             She  has a past medical history of Anxiety, Chest pain, Depression, Frequent headaches, and migraines. She does not have any pertinent problems on file. She  has a past surgical history that includes Mouth surgery (2013). Her family history includes Alcohol abuse in her paternal grandfather; Early death in her paternal grandfather; Heart attack in her maternal grandfather and paternal grandfather; Heart disease in her maternal grandfather; Hypertension in her maternal grandfather and paternal grandfather. She  reports that she has never smoked. She has never used smokeless tobacco. She reports that she does not currently use alcohol. She reports that she does not use drugs. She has a current medication list which includes the following prescription(s): escitalopram, multivitamin-prenatal, ibuprofen, levocetirizine, lorazepam, and omeprazole. She is allergic to oxycodone.       Review of Systems:  Review of Systems  Constitutional: Denied constitutional symptoms, night sweats, recent illness, fatigue, fever, insomnia and weight loss.  Eyes: Denied eye symptoms, eye pain, photophobia, vision change and visual disturbance.  Ears/Nose/Throat/Neck: Denied ear, nose, throat or neck symptoms, hearing loss, nasal discharge, sinus congestion  and sore throat.  Cardiovascular: Denied cardiovascular symptoms, arrhythmia, chest pain/pressure, edema, exercise intolerance, orthopnea and palpitations.  Respiratory: Denied pulmonary symptoms, asthma, pleuritic pain, productive sputum, cough, dyspnea and wheezing.  Gastrointestinal: Denied, gastro-esophageal reflux, melena, nausea and vomiting.  Genitourinary: Denied genitourinary symptoms including symptomatic vaginal discharge, pelvic relaxation issues, and urinary complaints.  Musculoskeletal: Denied musculoskeletal symptoms, stiffness, swelling, muscle weakness and myalgia.  Dermatologic: Denied dermatology symptoms, rash and scar.  Neurologic: Denied neurology symptoms, dizziness, headache, neck pain and syncope.  Psychiatric: Denied psychiatric symptoms, anxiety and depression.  Endocrine: Denied endocrine symptoms including hot flashes and night sweats.   Meds:   Current Outpatient Medications on File Prior to Visit  Medication Sig Dispense Refill   escitalopram (LEXAPRO) 10 MG tablet Take 10 mg by mouth daily.     Prenatal Vit-Fe Fumarate-FA (MULTIVITAMIN-PRENATAL) 27-0.8 MG TABS tablet Take 1 tablet by mouth daily at 12 noon.     IBUPROFEN PO Take by mouth as needed.     levocetirizine (XYZAL) 5 MG tablet Take 5 mg by mouth every evening.     LORazepam (ATIVAN) 1 MG tablet Take 1 mg by mouth 3 (three) times daily as needed for anxiety.      omeprazole (PRILOSEC) 40 MG capsule Take 40 mg by mouth daily.     No current facility-administered medications on file prior to visit.      Objective:     Vitals:   10/31/21 1334  BP: 101/70  Pulse: 94   Filed Weights   10/31/21 1334  Weight: 153 lb 14.4 oz (69.8 kg)  Uterus = U+ 1  Fetal heart tones 148          Assessment:    G1P0000 Patient Active Problem List   Diagnosis Date Noted   Chest tightness 04/11/2020   Viral upper respiratory infection 11/07/2016   Weight gain 10/07/2016   Nausea without  vomiting 06/19/2016   Rash 11/21/2015   Health care maintenance 11/21/2015   Depression 11/15/2015   Headache 11/15/2015   Stress 11/15/2015   Hearing loss 11/15/2015     1. Encounter for supervision of normal first pregnancy in second trimester   2. Encounter to establish care     Normal pregnancy at 20 weeks   Plan:            1.  Catch-up prenatal lab work as ordered  2.  Fetal anatomy scan ordered  3.  Continue prenatal care Orders No orders of the defined types were placed in this encounter.   No orders of the defined types were placed in this encounter.     F/U  Return in about 4 weeks (around 11/28/2021) for [facility]. I spent 31 minutes involved in the care of this patient preparing to see the patient by obtaining and reviewing her medical history (including labs, imaging tests and prior procedures), documenting clinical information in the electronic health record (EHR), counseling and coordinating care plans, writing and sending prescriptions, ordering tests or procedures and in direct communicating with the patient and medical staff discussing pertinent items from her history and physical exam.  Finis Bud, M.D. 10/31/2021 2:08 PM

## 2021-11-01 LAB — MONITOR DRUG PROFILE 14(MW)
Amphetamine Scrn, Ur: NEGATIVE ng/mL
BARBITURATE SCREEN URINE: NEGATIVE ng/mL
BENZODIAZEPINE SCREEN, URINE: NEGATIVE ng/mL
Buprenorphine, Urine: NEGATIVE ng/mL
CANNABINOIDS UR QL SCN: NEGATIVE ng/mL
Cocaine (Metab) Scrn, Ur: NEGATIVE ng/mL
Creatinine(Crt), U: 231.2 mg/dL (ref 20.0–300.0)
Fentanyl, Urine: NEGATIVE pg/mL
Meperidine Screen, Urine: NEGATIVE ng/mL
Methadone Screen, Urine: NEGATIVE ng/mL
OXYCODONE+OXYMORPHONE UR QL SCN: NEGATIVE ng/mL
Opiate Scrn, Ur: NEGATIVE ng/mL
Ph of Urine: 6.1 (ref 4.5–8.9)
Phencyclidine Qn, Ur: NEGATIVE ng/mL
Propoxyphene Scrn, Ur: NEGATIVE ng/mL
SPECIFIC GRAVITY: 1.017
Tramadol Screen, Urine: NEGATIVE ng/mL

## 2021-11-01 LAB — HGB SOLU + RFLX FRAC: Sickle Solubility Test - HGBRFX: NEGATIVE

## 2021-11-01 LAB — TSH: TSH: 0.861 u[IU]/mL (ref 0.450–4.500)

## 2021-11-01 LAB — HIV ANTIBODY (ROUTINE TESTING W REFLEX): HIV Screen 4th Generation wRfx: NONREACTIVE

## 2021-11-10 ENCOUNTER — Ambulatory Visit (INDEPENDENT_AMBULATORY_CARE_PROVIDER_SITE_OTHER)

## 2021-11-10 ENCOUNTER — Other Ambulatory Visit: Payer: Self-pay

## 2021-11-10 DIAGNOSIS — Z7689 Persons encountering health services in other specified circumstances: Secondary | ICD-10-CM | POA: Diagnosis not present

## 2021-11-10 DIAGNOSIS — Z3402 Encounter for supervision of normal first pregnancy, second trimester: Secondary | ICD-10-CM | POA: Diagnosis not present

## 2021-11-28 ENCOUNTER — Ambulatory Visit (INDEPENDENT_AMBULATORY_CARE_PROVIDER_SITE_OTHER): Admitting: Obstetrics and Gynecology

## 2021-11-28 ENCOUNTER — Encounter: Payer: Self-pay | Admitting: Obstetrics and Gynecology

## 2021-11-28 ENCOUNTER — Other Ambulatory Visit: Payer: Self-pay

## 2021-11-28 VITALS — BP 103/63 | HR 82 | Wt 158.7 lb

## 2021-11-28 DIAGNOSIS — Z113 Encounter for screening for infections with a predominantly sexual mode of transmission: Secondary | ICD-10-CM

## 2021-11-28 DIAGNOSIS — O99891 Other specified diseases and conditions complicating pregnancy: Secondary | ICD-10-CM

## 2021-11-28 DIAGNOSIS — M549 Dorsalgia, unspecified: Secondary | ICD-10-CM

## 2021-11-28 DIAGNOSIS — Z131 Encounter for screening for diabetes mellitus: Secondary | ICD-10-CM

## 2021-11-28 DIAGNOSIS — Z3A24 24 weeks gestation of pregnancy: Secondary | ICD-10-CM

## 2021-11-28 DIAGNOSIS — Z3402 Encounter for supervision of normal first pregnancy, second trimester: Secondary | ICD-10-CM

## 2021-11-28 LAB — POCT URINALYSIS DIPSTICK OB
Bilirubin, UA: NEGATIVE
Blood, UA: NEGATIVE
Glucose, UA: NEGATIVE
Ketones, UA: NEGATIVE
Leukocytes, UA: NEGATIVE
Nitrite, UA: NEGATIVE
POC,PROTEIN,UA: NEGATIVE
Spec Grav, UA: 1.01 (ref 1.010–1.025)
Urobilinogen, UA: 0.2 E.U./dL
pH, UA: 7 (ref 5.0–8.0)

## 2021-11-28 NOTE — Progress Notes (Signed)
ROB: She continues to have right lower back pain. She said it feels like her heart is racing all day, but her pulse is always normal.

## 2021-11-28 NOTE — Patient Instructions (Addendum)
Palpitations Palpitations are feelings that your heartbeat is not normal. Your heartbeat may feel like it is: Uneven (irregular). Faster than normal. Fluttering. Skipping a beat. This is usually not a serious problem. However, a doctor will do tests and check your medical history to make sure that you do not have a serious heart problem. Follow these instructions at home: Watch for any changes in your condition. Tell your doctor about any changes. Take these actions to help manage your symptoms: Eating and drinking Follow instructions from your doctor about things to eat and drink. You may be told to avoid these things: Drinks that have caffeine in them, such as coffee, tea, soft drinks, and energy drinks. Chocolate. Alcohol. Diet pills. Lifestyle   Try to lower your stress. These things can help you relax: Yoga. Deep breathing and meditation. Guided imagery. This is using words and images to create positive thoughts. Exercise, including swimming, jogging, and walking. Tell your doctor if you have more abnormal heartbeats when you are active. If you have chest pain or feel short of breath with exercise, do not keep doing the exercise until you are seen by your doctor. Biofeedback. This is using your mind to control things in your body, such as your heartbeat. Get plenty of rest and sleep. Keep a regular bed time. Do not use drugs, such as cocaine or ecstasy. Do not use marijuana. Do not smoke or use any products that contain nicotine or tobacco. If you need help quitting, ask your doctor. General instructions Take over-the-counter and prescription medicines only as told by your doctor. Keep all follow-up visits. You may need more tests if palpitations do not go away or get worse. Contact a doctor if: You keep having fast or uneven heartbeats for a long time. Your symptoms happen more often. Get help right away if: You have chest pain. You feel short of breath. You have a very bad  headache. You feel dizzy. You faint. These symptoms may be an emergency. Get help right away. Call your local emergency services (911 in the [place] Do not wait to see if the symptoms will go away. Do not drive yourself to the hospital. Summary Palpitations are feelings that your heartbeat is uneven or faster than normal. It may feel like your heart is fluttering or skipping a beat. Avoid food and drinks that may cause this condition. These include caffeine, chocolate, and alcohol. Try to lower your stress. Do not smoke or use drugs. Get help right away if you faint, feel dizzy, feel short of breath, have chest pain, or have a very bad headache. This information is not intended to replace advice given to you by your health care provider. Make sure you discuss any questions you have with your health care provider. Document Revised: 02/08/2021 Document Reviewed: 02/08/2021 Elsevier Patient Education  2022 Elsevier Inc.  

## 2021-11-28 NOTE — Progress Notes (Signed)
ROB: Notes that she feels palpitations.  Notes that her heart rate doesn't change but feels that her heart is racing at times.  Also c/o back pain, mostly on right side.  Has done massages.  Can also consider chiropractor and physical therapy, will place referral. Thinks she may be moving to [facility] soon (husband is in the [employer] but currently stationed in [place]).  Discussed [facility], advised that if she was unable to continue [facility] in [place] and took some time to get established at her next location, could do televisits. Can also check blood sugars for a week in lieu of 1 hr glucola.  Patient will make appointment for next visit but will let us know.  On further discussion, patient's palpitations may be more anxiety related as she has had to relocate recently and may be relocating again soon, has no local family, and husband is overseas. Discussed this with patient who thinks this might be the case. Normal anatomy.  RTC in 4 weeks for glucola if still in [place].

## 2021-12-26 ENCOUNTER — Other Ambulatory Visit

## 2021-12-26 ENCOUNTER — Encounter: Admitting: Obstetrics and Gynecology

## 2022-07-16 ENCOUNTER — Encounter: Payer: Self-pay | Admitting: Internal Medicine

## 2022-07-16 ENCOUNTER — Ambulatory Visit (INDEPENDENT_AMBULATORY_CARE_PROVIDER_SITE_OTHER): Payer: Self-pay | Admitting: Internal Medicine

## 2022-07-16 DIAGNOSIS — Z0289 Encounter for other administrative examinations: Secondary | ICD-10-CM

## 2022-07-16 DIAGNOSIS — F439 Reaction to severe stress, unspecified: Secondary | ICD-10-CM

## 2022-07-16 DIAGNOSIS — J358 Other chronic diseases of tonsils and adenoids: Secondary | ICD-10-CM

## 2022-07-16 NOTE — Progress Notes (Signed)
Patient ID: Kelly Eaton, female   DOB: May 15, 1996, 26 y.o.   MRN: [id number]   Subjective:    Patient ID: Kelly Eaton, female    DOB: 13-Jul-1996, 26 y.o.   MRN: [id number]   Patient here for  Chief Complaint  Patient presents with   Form Completion    New hire form completion paper work    .   HPI Here for work in for form completion.  Last visit 03/2020.  S/p recent birth.  Breast feeding.  Has moved back.  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  Doing well.  Handling stress.  Tries to stay active.  No chest pain or sob reported.  Eating.  No abdominal pain reported.  Taking lexapro.  Feels she is doing well on the medication.  History of tonsil stones.  Was planning tonsillectomy - [place].  Planning to get established with ENT here.  Some ear fullness.  Needs form completed.     Past Medical History:  Diagnosis Date   Anxiety    Chest pain    Depression    Frequent headaches    cluster headaches.  had MRI.    Hx of migraines    Past Surgical History:  Procedure Laterality Date   MOUTH SURGERY  2013   Family History  Problem Relation Age of Onset   Heart disease Maternal Grandfather    Hypertension Maternal Grandfather    Heart attack Maternal Grandfather    Alcohol abuse Paternal Grandfather    Hypertension Paternal Grandfather    Early death Paternal Grandfather    Heart attack Paternal Grandfather    Social History   Socioeconomic History   Marital status: Married    Spouse name: Not on file   Number of children: Not on file   Years of education: Not on file   Highest education level: Not on file  Occupational History   Not on file  Tobacco Use   Smoking status: Never   Smokeless tobacco: Never  Vaping Use   Vaping Use: Never used  Substance and Sexual Activity   Alcohol use: Not Currently   Drug use: Never   Sexual activity: Not Currently  Other Topics Concern   Not on file  Social History Narrative   Not on file   Social  Determinants of Health   Financial Resource Strain: Not on file  Food Insecurity: Not on file  Transportation Needs: Not on file  Physical Activity: Not on file  Stress: Not on file  Social Connections: Not on file     Review of Systems  Constitutional:  Negative for appetite change and unexpected weight change.  HENT:  Negative for congestion and sinus pressure.   Respiratory:  Negative for cough, chest tightness and shortness of breath.   Cardiovascular:  Negative for chest pain, palpitations and leg swelling.  Gastrointestinal:  Negative for abdominal pain, diarrhea, nausea and vomiting.  Genitourinary:  Negative for difficulty urinating and dysuria.  Musculoskeletal:  Negative for joint swelling and myalgias.  Skin:  Negative for color change and rash.  Neurological:  Negative for dizziness, light-headedness and headaches.  Psychiatric/Behavioral:  Negative for agitation and dysphoric mood.        Objective:     BP 116/70 (BP Location: Left Arm, Patient Position: Sitting, Cuff Size: Normal)   Pulse (!) 58   Temp 97.9 F (36.6 C) (Oral)   Ht 4\' 10"  (1.473 m)   Wt 151 lb 3.2 oz (68.6  kg)   SpO2 97%   BMI 31.60 kg/m  Wt Readings from Last 3 Encounters:  07/16/22 151 lb 3.2 oz (68.6 kg)  11/28/21 158 lb 11.2 oz (72 kg)  10/31/21 153 lb 14.4 oz (69.8 kg)    Physical Exam Vitals reviewed.  Constitutional:      General: She is not in acute distress.    Appearance: Normal appearance.  HENT:     Head: Normocephalic and atraumatic.     Right Ear: External ear normal.     Left Ear: External ear normal.     Mouth/Throat:     Pharynx: Oropharynx is clear. No oropharyngeal exudate or posterior oropharyngeal erythema.  Eyes:     General: No scleral icterus.       Right eye: No discharge.        Left eye: No discharge.     Conjunctiva/sclera: Conjunctivae normal.  Neck:     Thyroid: No thyromegaly.  Cardiovascular:     Rate and Rhythm: Normal rate and regular rhythm.   Pulmonary:     Effort: No respiratory distress.     Breath sounds: Normal breath sounds. No wheezing.  Abdominal:     General: Bowel sounds are normal.     Palpations: Abdomen is soft.     Tenderness: There is no abdominal tenderness.  Musculoskeletal:        General: No swelling or tenderness.     Cervical back: Neck supple. No tenderness.  Lymphadenopathy:     Cervical: No cervical adenopathy.  Skin:    Findings: No erythema or rash.  Neurological:     Mental Status: She is alert.  Psychiatric:        Mood and Affect: Mood normal.        Behavior: Behavior normal.      Outpatient Encounter Medications as of 07/16/2022  Medication Sig   escitalopram (LEXAPRO) 20 MG tablet escitalopram 20 mg oral tablet Start Date: 09/05/20 Status: Ordered   Prenatal Vit-Fe Fumarate-FA (MULTIVITAMIN-PRENATAL) 27-0.8 MG TABS tablet Take 1 tablet by mouth daily at 12 noon.   No facility-administered encounter medications on file as of 07/16/2022.     Lab Results  Component Value Date   WBC 7.5 06/19/2016   HGB 13.1 07/31/2021   HCT 39 07/31/2021   PLT 274 07/31/2021   GLUCOSE 79 06/19/2016   CHOL 146 11/15/2015   TRIG 79.0 11/15/2015   HDL 54.90 11/15/2015   LDLCALC 76 11/15/2015   ALT 13 06/19/2016   AST 16 06/19/2016   NA 139 06/19/2016   K 3.9 06/19/2016   CL 105 06/19/2016   CREATININE 1.01 06/19/2016   BUN 22 06/19/2016   CO2 27 06/19/2016   TSH 0.861 10/31/2021    NM Myocar Multi W/Spect W/Wall Motion / EF  Result Date: 04/14/2020 Pharmacological myocardial perfusion imaging study with no significant  ischemia Normal wall motion, EF estimated at 73% GI uptake artifact noted No EKG changes concerning for ischemia at peak stress or in recovery. CT attenuation correction images with no significant aortic atherosclerosis or coronary calcification Low risk scan Signed, Esmond Plants, MD, Ph.D [facility]       Assessment & Plan:   Problem List Items Addressed This Visit      Encounter for completion of form with patient    Form - complete.  Had Tdap 01/25/22.  Needs TB skin test.  Will obtain quantiferon.        Relevant Orders   QuantiFERON-TB Gold Plus  Stress    Appears to be doing well.  On lexapro.  Stable.  Follow.  Continue lexapro.       Tonsil stone    Evaluated when living in [place].  Was apparently planning evaluation for possible tonsillectomy.  Need to get established with ENT.         Dale Pomona, MD

## 2022-07-21 ENCOUNTER — Encounter: Payer: Self-pay | Admitting: Internal Medicine

## 2022-07-21 DIAGNOSIS — J358 Other chronic diseases of tonsils and adenoids: Secondary | ICD-10-CM | POA: Insufficient documentation

## 2022-07-21 DIAGNOSIS — Z0289 Encounter for other administrative examinations: Secondary | ICD-10-CM | POA: Insufficient documentation

## 2022-07-21 NOTE — Assessment & Plan Note (Signed)
Evaluated when living in [place].  Was apparently planning evaluation for possible tonsillectomy.  Need to get established with ENT.

## 2022-07-21 NOTE — Assessment & Plan Note (Signed)
Form - complete.  Had Tdap 01/25/22.  Needs TB skin test.  Will obtain quantiferon.

## 2022-07-21 NOTE — Assessment & Plan Note (Signed)
Appears to be doing well.  On lexapro.  Stable.  Follow.  Continue lexapro.

## 2022-08-02 ENCOUNTER — Other Ambulatory Visit (INDEPENDENT_AMBULATORY_CARE_PROVIDER_SITE_OTHER): Payer: BC Managed Care – PPO

## 2022-08-02 DIAGNOSIS — Z0289 Encounter for other administrative examinations: Secondary | ICD-10-CM | POA: Diagnosis not present

## 2022-08-07 LAB — QUANTIFERON-TB GOLD PLUS
Mitogen-NIL: >10 IU/mL
NIL: 0.04 IU/mL
QuantiFERON-TB Gold Plus: NEGATIVE
TB1-NIL: <0 IU/mL
TB2-NIL: <0 IU/mL

## 2022-08-08 ENCOUNTER — Telehealth: Payer: Self-pay

## 2022-08-08 NOTE — Telephone Encounter (Signed)
Patient called and note was read. 

## 2022-08-08 NOTE — Telephone Encounter (Signed)
Lm for pt to advise documents faxed

## 2022-09-11 ENCOUNTER — Telehealth (INDEPENDENT_AMBULATORY_CARE_PROVIDER_SITE_OTHER): Payer: BC Managed Care – PPO | Admitting: Medical

## 2022-09-11 ENCOUNTER — Other Ambulatory Visit: Payer: Self-pay

## 2022-09-11 ENCOUNTER — Telehealth: Payer: Self-pay | Admitting: Internal Medicine

## 2022-09-11 DIAGNOSIS — R059 Cough, unspecified: Secondary | ICD-10-CM | POA: Diagnosis not present

## 2022-09-11 DIAGNOSIS — J0100 Acute maxillary sinusitis, unspecified: Secondary | ICD-10-CM | POA: Diagnosis not present

## 2022-09-11 DIAGNOSIS — J029 Acute pharyngitis, unspecified: Secondary | ICD-10-CM

## 2022-09-11 DIAGNOSIS — H9203 Otalgia, bilateral: Secondary | ICD-10-CM | POA: Diagnosis not present

## 2022-09-11 DIAGNOSIS — R0981 Nasal congestion: Secondary | ICD-10-CM

## 2022-09-11 MED ORDER — AZITHROMYCIN 250 MG PO TABS: ORAL_TABLET | ORAL | 0 refills | Status: AC

## 2022-09-11 MED ORDER — BENZONATATE 100 MG PO CAPS: 100.0000 mg | ORAL_CAPSULE | Freq: Two times a day (BID) | ORAL | 0 refills | Status: DC | PRN

## 2022-09-11 NOTE — Progress Notes (Signed)
   Subjective:    Patient ID: Kelly Eaton, female    DOB: 06-09-1996, 26 y.o.   MRN: [id number]  HPI Virtual Visit via Video Note  I connected with Kelly Eaton on 09/11/22 at  2:40 PM EST by a video enabled telemedicine application and verified that I am speaking with the correct person using two identifiers.  Location: Patient: home Provider: office   I discussed the limitations of evaluation and management by telemedicine and the availability of in person appointments. The patient expressed understanding and agreed to proceed.  No vitals given.  History of Present Illness: Pt states since Friday she has been having  pnd drainage,  runny nose st and productive cough. No fever. Pt is a Runner, broadcasting/film/video.  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 She has breast milk stored up. Baby will  take bottle.   Pt has some neck pain and some upper back pain which she thinks is from coughing.  When pt palpates maxillary sinus pressure. Pt ears do hurt. Pt throat feels swollen.   Pt did covid test and was negative.  Pt called pcp her pcp office and she got scheduled for online visit.     Observations/Objective:(connection establshed  twice but had to rever to phone due to audio issues) General-no acute distress, pleasant, oriented. Lungs- on inspection lungs appear unlabored. Neck- no tracheal deviation or jvd on inspection. Neuro- gross motor function appears intact.   Assessment and Plan:  Patient Instructions  Concern for possible sinus infection, ear infection and pharyngitis.  Moderate symptoms described since Friday.  Some bodyaches as well.  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  Prescribe azithromycin antibiotic, benzonatate for cough and she will continue her Flonase.  Patient is currently breast-feeding and recommend that she use her stored breastmilk and bottle feed over the next  7 days while taking benzonatate.  Then resume breast-feeding 24 hours after finishing benzonatate.  Recommend that she call her PCP office and [place] to get rapid flu test today or tomorrow.  If signs symptoms worsen, change or persist despite above measures recommend being seen in person at PCP office.  Any severe signs symptoms change recommend emergency department evaluation.   Esperanza Richters, PA-C  Follow Up Instructions:    I discussed the assessment and treatment plan with the patient. The patient was provided an opportunity to ask questions and all were answered. The patient agreed with the plan and demonstrated an understanding of the instructions.   The patient was advised to call back or seek an in-person evaluation if the symptoms worsen or if the condition fails to improve as anticipated.     Esperanza Richters, PA-C    Review of Systems  Constitutional:  Negative for diaphoresis, fatigue and fever.  HENT:  Positive for congestion, postnasal drip and sore throat.   Respiratory:  Positive for wheezing.   Cardiovascular:  Negative for chest pain and palpitations.  Gastrointestinal:  Negative for abdominal pain.  Musculoskeletal:  Negative for back pain and neck pain.  Neurological:  Negative for dizziness, seizures and headaches.  Hematological:  Positive for adenopathy. Does not bruise/bleed easily.  Psychiatric/Behavioral:  Negative for self-injury. The patient is not nervous/anxious.          Objective:   Physical Exam        Assessment & Plan:

## 2022-09-11 NOTE — Telephone Encounter (Signed)
Pt called stating she was supposed to get a flu test at the office but no orders are in yet

## 2022-09-11 NOTE — Patient Instructions (Signed)
Concern for possible sinus infection, ear infection and pharyngitis.  Moderate symptoms described since Friday.  Some bodyaches as well.  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  Prescribe azithromycin antibiotic, benzonatate for cough and she will continue her Flonase.  Patient is currently breast-feeding and recommend that she use her stored breastmilk and bottle feed over the next 7 days while taking benzonatate.  Then resume breast-feeding 24 hours after finishing benzonatate.  Recommend that she call her PCP office and [place] to get rapid flu test today or tomorrow.  If signs symptoms worsen, change or persist despite above measures recommend being seen in person at PCP office.  Any severe signs symptoms change recommend emergency department evaluation.

## 2022-09-11 NOTE — Telephone Encounter (Signed)
Called to let pt know that we can do a flu test today.  LMTCB.  Jenate, CMA is aware.

## 2022-09-12 ENCOUNTER — Other Ambulatory Visit (INDEPENDENT_AMBULATORY_CARE_PROVIDER_SITE_OTHER): Payer: BC Managed Care – PPO

## 2022-09-12 ENCOUNTER — Other Ambulatory Visit: Payer: Self-pay | Admitting: *Deleted

## 2022-09-12 DIAGNOSIS — R059 Cough, unspecified: Secondary | ICD-10-CM

## 2022-09-12 LAB — POCT INFLUENZA A/B
Influenza A, POC: POSITIVE — AB
Influenza B, POC: NEGATIVE

## 2022-09-12 NOTE — Progress Notes (Signed)
Pt arrived at office for Flu swab at suggestion of other provider. Pt appeared with hoarse voice, some coughing and congestion but overall stable.  Pt was swabbed for POC Flu A/B  POC test resulted POSITIVE for Flu A

## 2022-09-12 NOTE — Telephone Encounter (Signed)
Pt called stated the test came back that she was positive for the flu and what to know what she can take because she is breast feeding.

## 2022-09-27 ENCOUNTER — Encounter: Payer: Self-pay | Admitting: Internal Medicine

## 2022-09-28 ENCOUNTER — Ambulatory Visit
Admission: RE | Admit: 2022-09-28 | Discharge: 2022-09-28 | Disposition: A | Discharge status: Home / Self Care | Payer: BC Managed Care – PPO | Source: Ambulatory Visit | Attending: Internal Medicine | Admitting: Internal Medicine

## 2022-09-28 VITALS — BP 113/74 | HR 78 | Temp 98.0°F | Resp 18 | Ht 59.0 in | Wt 148.0 lb

## 2022-09-28 DIAGNOSIS — J069 Acute upper respiratory infection, unspecified: Secondary | ICD-10-CM

## 2022-09-28 NOTE — ED Provider Notes (Signed)
Kelly Eaton    CSN: [id number] Arrival date & time: 09/28/22  1858      History   Chief Complaint Chief Complaint  Patient presents with   Cough    HPI Kelly Eaton is a 26 y.o. female.  Patient presents with congestion, postnasal drip, cough x 2 days.  She denies fever, chest pain, shortness of breath, vomiting, diarrhea, or other symptoms.  Treatment at home with ibuprofen.  Patient had a video visit with her PCP on 09/11/2022; diagnosed with cough, congestion, sinusitis, ear pain, pharyngitis; treated with Zithromax and Tessalon Perles; Patient states she did not take either of these medications due to concern for breastfeeding while taking them.  She tested positive for influenza A on 09/12/2022.  Her symptoms resolved and she has been well until 2 days ago when her current symptoms started.   The history is provided by the patient and medical records.    Past Medical History:  Diagnosis Date   Anxiety    Chest pain    Depression    Frequent headaches    cluster headaches.  had MRI.    Hx of migraines     Patient Active Problem List   Diagnosis Date Noted   Encounter for completion of form with patient 07/21/2022   Tonsil stone 07/21/2022   Chest tightness 04/11/2020   Viral upper respiratory infection 11/07/2016   Weight gain 10/07/2016   Nausea without vomiting 06/19/2016   Rash 11/21/2015   Health care maintenance 11/21/2015   Depression 11/15/2015   Headache 11/15/2015   Stress 11/15/2015   Hearing loss 11/15/2015    Past Surgical History:  Procedure Laterality Date   MOUTH SURGERY  2013    OB History     Gravida  1   Para  0   Term  0   Preterm  0   AB  0   Living  0      SAB  0   IAB  0   Ectopic  0   Multiple  0   Live Births  0            Home Medications    Prior to Admission medications   Medication Sig Start Date End Date Taking? Authorizing Provider  benzonatate (TESSALON) 100 MG capsule Take 1  capsule (100 mg total) by mouth 2 (two) times daily as needed for cough. 09/11/22   Saguier, Ramon Dredge, PA-C  escitalopram (LEXAPRO) 20 MG tablet escitalopram 20 mg oral tablet Start Date: 09/05/20 Status: Ordered 09/05/20   [provider]  Prenatal Vit-Fe Fumarate-FA (MULTIVITAMIN-PRENATAL) 27-0.8 MG TABS tablet Take 1 tablet by mouth daily at 12 noon.    [provider]    Family History Family History  Problem Relation Age of Onset   Heart disease Maternal Grandfather    Hypertension Maternal Grandfather    Heart attack Maternal Grandfather    Alcohol abuse Paternal Grandfather    Hypertension Paternal Grandfather    Early death Paternal Grandfather    Heart attack Paternal Grandfather     Social History Social History   Tobacco Use   Smoking status: Never   Smokeless tobacco: Never  Vaping Use   Vaping Use: Never used  Substance Use Topics   Alcohol use: Not Currently   Drug use: Never     Allergies   Oxycodone   Review of Systems Review of Systems  Constitutional:  Negative for chills and fever.  HENT:  Positive for congestion  and postnasal drip. Negative for ear pain and sore throat.   Respiratory:  Positive for cough. Negative for shortness of breath.   Cardiovascular:  Negative for chest pain and palpitations.  Gastrointestinal:  Negative for abdominal pain, diarrhea and vomiting.  All other systems reviewed and are negative.    Physical Exam Triage Vital Signs ED Triage Vitals  Enc Vitals Group     BP 09/28/22 1928 113/74     Pulse Rate 09/28/22 1928 78     Resp 09/28/22 1928 18     Temp 09/28/22 1928 98 F (36.7 C)     Temp Source 09/28/22 1928 Oral     SpO2 09/28/22 1928 98 %     Weight 09/28/22 1926 148 lb (67.1 kg)     Height 09/28/22 1926 4\' 11"  (1.499 m)     Head Circumference --      Peak Flow --      Pain Score 09/28/22 1926 2     Pain Loc --      Pain Edu? --      Excl. in GC? --    No data found.  Updated Vital  Signs BP 113/74   Pulse 78   Temp 98 F (36.7 C) (Oral)   Resp 18   Ht 4\' 11"  (1.499 m)   Wt 148 lb (67.1 kg)   SpO2 98%   Breastfeeding Yes   BMI 29.89 kg/m   Visual Acuity Right Eye Distance:   Left Eye Distance:   Bilateral Distance:    Right Eye Near:   Left Eye Near:    Bilateral Near:     Physical Exam Vitals and nursing note reviewed.  Constitutional:      General: She is not in acute distress.    Appearance: She is well-developed. She is not ill-appearing.  HENT:     Right Ear: Tympanic membrane normal.     Left Ear: Tympanic membrane normal.     Nose: Nose normal.     Mouth/Throat:     Mouth: Mucous membranes are moist.     Pharynx: Oropharynx is clear.  Eyes:     Conjunctiva/sclera: Conjunctivae normal.  Cardiovascular:     Rate and Rhythm: Normal rate and regular rhythm.     Heart sounds: Normal heart sounds.  Pulmonary:     Effort: Pulmonary effort is normal. No respiratory distress.     Breath sounds: Normal breath sounds.  Musculoskeletal:     Cervical back: Neck supple.  Skin:    General: Skin is warm and dry.  Neurological:     Mental Status: She is alert.  Psychiatric:        Mood and Affect: Mood normal.        Behavior: Behavior normal.      UC Treatments / Results  Labs (all labs ordered are listed, but only abnormal results are displayed) Labs Reviewed - No data to display  EKG   Radiology No results found.  Procedures Procedures (including critical care time)  Medications Ordered in UC Medications - No data to display  Initial Impression / Assessment and Plan / UC Course  I have reviewed the triage vital signs and the nursing notes.  Pertinent labs & imaging results that were available during my care of the patient were reviewed by me and considered in my medical decision making (see chart for details).   Viral URI.  Patient is breast-feeding.  She is well-appearing and her exam is reassuring.  Afebrile  and vital signs  are stable.  Lungs are clear.  Discussed symptomatic treatment including Tylenol or ibuprofen.  Instructed patient to follow up with her PCP if her symptoms are not improving.  She agrees to plan of care.     Final Clinical Impressions(s) / UC Diagnoses   Final diagnoses:  Viral URI     Discharge Instructions      Continue symptomatic treatment as needed.  Follow up with your primary care provider if your symptoms are not improving.        ED Prescriptions   None    PDMP not reviewed this encounter.   Mickie Bail, NP 09/28/22 1956

## 2022-09-28 NOTE — Discharge Instructions (Addendum)
Continue symptomatic treatment as needed.  Follow up with your primary care provider if your symptoms are not improving.

## 2022-09-28 NOTE — ED Triage Notes (Addendum)
Patient to Urgent Care with complaints of productive cough and nasal congestion. Reports a lot of post nasal drip. Fatigue. Denies any known fever. Symptoms started two days ago.  Reports having the flu A two weeks ago (tested at [facility]). Reports still having a lot of discolored mucus when coughing.  Negative covid test. Using flonase spray.  Patient currently breastfeeding.

## 2022-10-03 ENCOUNTER — Telehealth (INDEPENDENT_AMBULATORY_CARE_PROVIDER_SITE_OTHER): Payer: BC Managed Care – PPO | Admitting: Family Medicine

## 2022-10-03 ENCOUNTER — Encounter: Payer: Self-pay | Admitting: Family Medicine

## 2022-10-03 VITALS — Ht <= 58 in | Wt 148.0 lb

## 2022-10-03 DIAGNOSIS — H1030 Unspecified acute conjunctivitis, unspecified eye: Secondary | ICD-10-CM | POA: Diagnosis not present

## 2022-10-03 DIAGNOSIS — J0100 Acute maxillary sinusitis, unspecified: Secondary | ICD-10-CM

## 2022-10-03 MED ORDER — PREDNISONE 20 MG PO TABS: 20.0000 mg | ORAL_TABLET | Freq: Every day | ORAL | 0 refills | Status: AC

## 2022-10-03 MED ORDER — ERYTHROMYCIN 5 MG/GM OP OINT: 1.0000 | TOPICAL_OINTMENT | Freq: Two times a day (BID) | OPHTHALMIC | 0 refills | Status: AC

## 2022-10-03 MED ORDER — AZITHROMYCIN 250 MG PO TABS: ORAL_TABLET | ORAL | 0 refills | Status: AC

## 2022-10-03 NOTE — Patient Instructions (Addendum)
It was a pleasure meeting you today. Thank you for allowing me to take part in your health care.  Our goals for today as we discussed include:  Conjunctivitis Start erythromycin ointment two times a day for 5 days  Sinusitis Start a Zithromax 500 mg x 1 dose then to 50 mg daily x 4 days Start prednisone 20 mg daily  Do not breast-feed while on prednisone.  If worsening shortness of breath, chest pain, fever or unable to stay hydrated please notify MD or go to urgent care for evaluation.  If you have any questions or concerns, please do not hesitate to call the office at [phone].  I look forward to our next visit and until then take care and stay safe.  Regards,   Carollee Leitz, MD   [facility]

## 2022-10-03 NOTE — Progress Notes (Unsigned)
Virtual Visit via Video note  I connected with BRADLEE BRIDGERS on 10/14/22 at 1150 by video and verified that I am speaking with the correct person using two identifiers. YEIRA GULDEN is currently located at home and brother Thurmond Butts is currently with her during visit. The provider, Carollee Leitz, MD is located in their office at time of visit.  I discussed the limitations, risks, security and privacy concerns of performing an evaluation and management service by video and the availability of in person appointments. I also discussed with the patient that there may be a patient responsible charge related to this service. The patient expressed understanding and agreed to proceed.  Subjective: PCP: Einar Pheasant, MD  Chief Complaint  Patient presents with   Acute Home Visit    Cough/congestion/red eye/ pus drainage/ runny nose/ post nasal/ no smell/ no taste/ started yesterday     HPI  Patient reports symptoms of cough and nasal congestion.  Symptoms started yesterday.  She is also having right ear pain and sore throat.  Reports productive cough with yellow mucus.  Having thick nasal discharge.  Has tried Flonase without much relief.  She reports having some posttussive chest pain, does not radiate.  No pain on inspiration or expiration.  Endorses some expiratory wheezing.  She also endorses itchy eyes and bright eye redness.  Had noticed that it eyes were stuck together this morning with yellow crust.  Denies any fevers, visual changes, ocular pain, myalgias, shortness of breath, chills, diarrhea or abdominal pain.  Has been hydrating well.  Was positive for influenza A on December 13.  Has had 2 home COVID test that were negative.  She works as a Pharmacist, hospital.   ROS: Per HPI  Current Outpatient Medications:    escitalopram (LEXAPRO) 20 MG tablet, escitalopram 20 mg oral tablet Start Date: 09/05/20 Status: Ordered, Disp: , Rfl:    Prenatal Vit-Fe Fumarate-FA (MULTIVITAMIN-PRENATAL) 27-0.8 MG TABS  tablet, Take 1 tablet by mouth daily at 12 noon., Disp: , Rfl:   Observations/Objective: Physical Exam Constitutional:      General: She is not in acute distress.    Appearance: She is ill-appearing. She is not toxic-appearing.  HENT:     Nose: Congestion and rhinorrhea present.  Eyes:     General:        Right eye: Discharge present.        Left eye: Discharge present. Pulmonary:     Effort: Pulmonary effort is normal.  Neurological:     Mental Status: She is alert and oriented to person, place, and time. Mental status is at baseline.  Psychiatric:        Mood and Affect: Mood normal.        Behavior: Behavior normal.        Thought Content: Thought content normal.        Judgment: Judgment normal.    Assessment and Plan: Acute non-recurrent maxillary sinusitis Assessment & Plan: Increased nasal discharge and facial pressure after recent viral infection Does not appear in any respiratory distress.  Unable to complete exam given nature of visit.   Start Z pack x 5 days Start prednisone 20 mg daily x 5 days Discontinue Tessalon Perles, not compatible with breast-feeding. Honey tea for cough. Recommend that patient not breast-feed while on prednisone.  Patient agreeable and reports has plenty of milk stored for 5 days of steroid. Continue to stay hydrated Tylenol for discomfort Follow-up with PCP if no improvement in symptoms. Strict return  precautions provided.   Orders: -     Azithromycin; Take 2 tablets on day 1, then 1 tablet daily on days 2 through 5  Dispense: 6 tablet; Refill: 0 -     predniSONE; Take 1 tablet (20 mg total) by mouth daily with breakfast for 5 days.  Dispense: 5 tablet; Refill: 0  Acute conjunctivitis, unspecified acute conjunctivitis type, unspecified laterality Assessment & Plan: Appears to have a conjunctival erythema on video visit.  Could not visualize any discharge.  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 Start erythromycin ointment twice  daily x 5 days Follow-up with PCP if no improvement in symptoms Strict return precautions provided   Orders: -     Erythromycin; Place 1 Application into the right eye 2 (two) times daily for 5 days.  Dispense: 10 g; Refill: 0    Follow Up Instructions: No follow-ups on file.   I discussed the assessment and treatment plan with the patient. The patient was provided an opportunity to ask questions and all were answered. The patient agreed with the plan and demonstrated an understanding of the instructions.   The patient was advised to call back or seek an in-person evaluation if the symptoms worsen or if the condition fails to improve as anticipated.  The above assessment and management plan was discussed with the patient. The patient verbalized understanding of and has agreed to the management plan. Patient is aware to call the clinic if symptoms persist or worsen. Patient is aware when to return to the clinic for a follow-up visit. Patient educated on when it is appropriate to go to the emergency department.   PDMP reviewed  Carollee Leitz, MD

## 2022-10-14 ENCOUNTER — Encounter: Payer: Self-pay | Admitting: Family Medicine

## 2022-10-14 DIAGNOSIS — H109 Unspecified conjunctivitis: Secondary | ICD-10-CM | POA: Insufficient documentation

## 2022-10-14 DIAGNOSIS — J329 Chronic sinusitis, unspecified: Secondary | ICD-10-CM | POA: Insufficient documentation

## 2022-10-14 NOTE — Assessment & Plan Note (Signed)
Appears to have a conjunctival erythema on video visit.  Could not visualize any discharge.  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 Start erythromycin ointment twice daily x 5 days Follow-up with PCP if no improvement in symptoms Strict return precautions provided

## 2022-10-14 NOTE — Assessment & Plan Note (Addendum)
Increased nasal discharge and facial pressure after recent viral infection Does not appear in any respiratory distress.  Unable to complete exam given nature of visit.   Start Z pack x 5 days Start prednisone 20 mg daily x 5 days Discontinue Tessalon Perles, not compatible with breast-feeding. Honey tea for cough. Recommend that patient not breast-feed while on prednisone.  Patient agreeable and reports has plenty of milk stored for 5 days of steroid. Continue to stay hydrated Tylenol for discomfort Follow-up with PCP if no improvement in symptoms. Strict return precautions provided.

## 2022-11-30 ENCOUNTER — Encounter: Payer: Self-pay | Admitting: Internal Medicine

## 2022-11-30 ENCOUNTER — Ambulatory Visit: Payer: BC Managed Care – PPO | Admitting: Internal Medicine

## 2022-11-30 VITALS — BP 118/78 | HR 71 | Temp 97.9°F | Resp 16 | Ht <= 58 in | Wt 151.0 lb

## 2022-11-30 DIAGNOSIS — F331 Major depressive disorder, recurrent, moderate: Secondary | ICD-10-CM | POA: Diagnosis not present

## 2022-11-30 DIAGNOSIS — R112 Nausea with vomiting, unspecified: Secondary | ICD-10-CM

## 2022-11-30 MED ORDER — ONDANSETRON HCL 4 MG PO TABS: 4.0000 mg | ORAL_TABLET | Freq: Three times a day (TID) | ORAL | 0 refills | Status: DC | PRN

## 2022-11-30 NOTE — Progress Notes (Unsigned)
   Subjective:    Patient ID: Kelly Eaton, female    DOB: 30-Dec-1995, 27 y.o.   MRN: [id number]  Patient here for No chief complaint on file.   HPI Here for work in appt.   On lexapro.   Diagnosed with flu - 09/12/22. Seen 10/03/22 - diagnosed with sinus inrection.  Treated with prednisone and zpak.   Past Medical History:  Diagnosis Date   Anxiety    Chest pain    Depression    Frequent headaches    cluster headaches.  had MRI.    Hx of migraines    Past Surgical History:  Procedure Laterality Date   MOUTH SURGERY  2013   Family History  Problem Relation Age of Onset   Heart disease Maternal Grandfather    Hypertension Maternal Grandfather    Heart attack Maternal Grandfather    Alcohol abuse Paternal Grandfather    Hypertension Paternal Grandfather    Early death Paternal Grandfather    Heart attack Paternal Grandfather    Social History   Socioeconomic History   Marital status: Married    Spouse name: Not on file   Number of children: Not on file   Years of education: Not on file   Highest education level: Not on file  Occupational History   Not on file  Tobacco Use   Smoking status: Never   Smokeless tobacco: Never  Vaping Use   Vaping Use: Never used  Substance and Sexual Activity   Alcohol use: Not Currently   Drug use: Never   Sexual activity: Not Currently  Other Topics Concern   Not on file  Social History Narrative   Not on file   Social Determinants of Health   Financial Resource Strain: Not on file  Food Insecurity: Not on file  Transportation Needs: Not on file  Physical Activity: Not on file  Stress: Not on file  Social Connections: Not on file     Review of Systems     Objective:     There were no vitals taken for this visit. Wt Readings from Last 3 Encounters:  10/03/22 148 lb (67.1 kg)  09/28/22 148 lb (67.1 kg)  07/16/22 151 lb 3.2 oz (68.6 kg)    Physical Exam   Outpatient Encounter Medications as of 11/30/2022   Medication Sig   escitalopram (LEXAPRO) 20 MG tablet escitalopram 20 mg oral tablet Start Date: 09/05/20 Status: Ordered   Prenatal Vit-Fe Fumarate-FA (MULTIVITAMIN-PRENATAL) 27-0.8 MG TABS tablet Take 1 tablet by mouth daily at 12 noon.   No facility-administered encounter medications on file as of 11/30/2022.     Lab Results  Component Value Date   WBC 7.5 06/19/2016   HGB 13.1 07/31/2021   HCT 39 07/31/2021   PLT 274 07/31/2021   GLUCOSE 79 06/19/2016   CHOL 146 11/15/2015   TRIG 79.0 11/15/2015   HDL 54.90 11/15/2015   LDLCALC 76 11/15/2015   ALT 13 06/19/2016   AST 16 06/19/2016   NA 139 06/19/2016   K 3.9 06/19/2016   CL 105 06/19/2016   CREATININE 1.01 06/19/2016   BUN 22 06/19/2016   CO2 27 06/19/2016   TSH 0.861 10/31/2021    No results found.     Assessment & Plan:  There are no diagnoses linked to this encounter.   Einar Pheasant, MD

## 2022-12-01 ENCOUNTER — Encounter: Payer: Self-pay | Admitting: Internal Medicine

## 2022-12-01 DIAGNOSIS — R1110 Vomiting, unspecified: Secondary | ICD-10-CM | POA: Insufficient documentation

## 2022-12-01 NOTE — Assessment & Plan Note (Signed)
Doing better.  Feeling better.  Continue lexapro.  Hold on making changes.  F/u with therapist.

## 2022-12-01 NOTE — Assessment & Plan Note (Signed)
Acute diarrhea and vomiting today.  Unclear etiology.  POC covid and flu negative.  Discussed need to recheck covid in two days.  Discussed quarantine guidelines.  Zofran for nausea.  Stay hydrated. Bland foods.  Advance slowly.  Is breast feeding.  Will use save mild supply.  Follow closely.  Call with update.

## 2022-12-04 ENCOUNTER — Encounter: Payer: Self-pay | Admitting: Internal Medicine

## 2022-12-04 LAB — POC INFLUENZA A&B (BINAX/QUICKVUE)
Influenza A, POC: NEGATIVE
Influenza B, POC: NEGATIVE

## 2022-12-04 LAB — POC COVID19 BINAXNOW: SARS Coronavirus 2 Ag: NEGATIVE

## 2022-12-04 NOTE — Telephone Encounter (Signed)
Thank her for the update.  Glad she is feeling better.  Have her advance diet slowly.  Keep [place] posted and let us know if needs anything.

## 2022-12-20 ENCOUNTER — Ambulatory Visit: Payer: BC Managed Care – PPO | Admitting: Internal Medicine

## 2022-12-20 ENCOUNTER — Encounter: Payer: Self-pay | Admitting: Internal Medicine

## 2022-12-20 VITALS — BP 114/70 | HR 72 | Ht 59.0 in | Wt 151.8 lb

## 2022-12-20 DIAGNOSIS — H938X3 Other specified disorders of ear, bilateral: Secondary | ICD-10-CM | POA: Diagnosis not present

## 2022-12-20 DIAGNOSIS — F331 Major depressive disorder, recurrent, moderate: Secondary | ICD-10-CM | POA: Diagnosis not present

## 2022-12-20 DIAGNOSIS — H938X9 Other specified disorders of ear, unspecified ear: Secondary | ICD-10-CM | POA: Insufficient documentation

## 2022-12-20 DIAGNOSIS — M545 Low back pain, unspecified: Secondary | ICD-10-CM

## 2022-12-20 MED ORDER — ESCITALOPRAM OXALATE 20 MG PO TABS: 20.0000 mg | ORAL_TABLET | Freq: Every day | ORAL | 1 refills | Status: DC

## 2022-12-20 NOTE — Progress Notes (Signed)
Subjective:    Patient ID: Kelly Eaton, female    DOB: [birth year], 27 y.o.   MRN: [id number]  Patient here for  Chief Complaint  Patient presents with   Back Pain   Hip Pain    HPI Here for work in appt - reports increased low back pain.  Right lower back - into buttock.  Has had issues with this pain previously.  Walking is painful.  Has been doing stretches.  Massage gun  - helped some.  Took two motrin.  Prefers to avoid medication.  She is breast feeding.  Stiff in am.  Does not notice pain as much when sitting.  Takes a while to get going.  Also has noticed persistent increased fullness in ears.  No increased congestion currently.  Previous bowel issues have resolved.     Past Medical History:  Diagnosis Date   Anxiety    Chest pain    Depression    Frequent headaches    cluster headaches.  had MRI.    Hx of migraines    Past Surgical History:  Procedure Laterality Date   MOUTH SURGERY  2013   Family History  Problem Relation Age of Onset   Heart disease Maternal Grandfather    Hypertension Maternal Grandfather    Heart attack Maternal Grandfather    Alcohol abuse Paternal Grandfather    Hypertension Paternal Grandfather    Early death Paternal Grandfather    Heart attack Paternal Grandfather    Social History   Socioeconomic History   Marital status: Married    Spouse name: Not on file   Number of children: Not on file   Years of education: Not on file   Highest education level: Not on file  Occupational History   Not on file  Tobacco Use   Smoking status: Never   Smokeless tobacco: Never  Vaping Use   Vaping Use: Never used  Substance and Sexual Activity   Alcohol use: Not Currently   Drug use: Never   Sexual activity: Not Currently  Other Topics Concern   Not on file  Social History Narrative   Not on file   Social Determinants of Health   Financial Resource Strain: Not on file  Food Insecurity: Not on file  Transportation Needs: Not on  file  Physical Activity: Not on file  Stress: Not on file  Social Connections: Not on file     Review of Systems  Constitutional:  Negative for appetite change and unexpected weight change.  HENT:  Negative for congestion.        Ear fullness - hearing change.   Respiratory:  Negative for cough, chest tightness and shortness of breath.   Cardiovascular:  Negative for chest pain and palpitations.  Gastrointestinal:  Negative for abdominal pain, diarrhea, nausea and vomiting.  Genitourinary:  Negative for difficulty urinating and dysuria.  Musculoskeletal:  Negative for joint swelling and myalgias.  Skin:  Negative for color change and rash.  Neurological:  Negative for dizziness and headaches.  Psychiatric/Behavioral:  Negative for agitation and dysphoric mood.        Objective:     BP 114/70   Pulse 72   Ht 4\' 11"  (1.499 m)   Wt 151 lb 12.8 oz (68.9 kg)   SpO2 98%   BMI 30.66 kg/m  Wt Readings from Last 3 Encounters:  12/24/22 151 lb 12.8 oz (68.9 kg)  11/30/22 151 lb (68.5 kg)  10/03/22 148 lb (67.1 kg)  Physical Exam Vitals reviewed.  Constitutional:      General: She is not in acute distress.    Appearance: Normal appearance.  HENT:     Head: Normocephalic and atraumatic.     Right Ear: Ear canal and external ear normal.     Left Ear: Ear canal and external ear normal.     Ears:     Comments: TMs visualized - without erythema.     Mouth/Throat:     Pharynx: No oropharyngeal exudate or posterior oropharyngeal erythema.  Eyes:     General: No scleral icterus.       Right eye: No discharge.        Left eye: No discharge.     Conjunctiva/sclera: Conjunctivae normal.  Neck:     Thyroid: No thyromegaly.  Cardiovascular:     Rate and Rhythm: Normal rate and regular rhythm.  Pulmonary:     Effort: No respiratory distress.     Breath sounds: Normal breath sounds. No wheezing.  Abdominal:     General: Bowel sounds are normal.     Palpations: Abdomen is  soft.     Tenderness: There is no abdominal tenderness.  Musculoskeletal:        General: No swelling or tenderness.     Cervical back: Neck supple. No tenderness.  Lymphadenopathy:     Cervical: No cervical adenopathy.  Skin:    Findings: No erythema or rash.  Neurological:     Mental Status: She is alert.  Psychiatric:        Mood and Affect: Mood normal.        Behavior: Behavior normal.      Outpatient Encounter Medications as of 12/20/2022  Medication Sig   escitalopram (LEXAPRO) 20 MG tablet Take 1 tablet (20 mg total) by mouth daily.   [DISCONTINUED] escitalopram (LEXAPRO) 20 MG tablet escitalopram 20 mg oral tablet Start Date: 09/05/20 Status: Ordered   [DISCONTINUED] ondansetron (ZOFRAN) 4 MG tablet Take 1 tablet (4 mg total) by mouth every 8 (eight) hours as needed for nausea or vomiting.   [DISCONTINUED] Prenatal Vit-Fe Fumarate-FA (MULTIVITAMIN-PRENATAL) 27-0.8 MG TABS tablet Take 1 tablet by mouth daily at 12 noon.   No facility-administered encounter medications on file as of 12/20/2022.     Lab Results  Component Value Date   WBC 7.5 06/19/2016   HGB 13.1 07/31/2021   HCT 39 07/31/2021   PLT 274 07/31/2021   GLUCOSE 79 06/19/2016   CHOL 146 11/15/2015   TRIG 79.0 11/15/2015   HDL 54.90 11/15/2015   LDLCALC 76 11/15/2015   ALT 13 06/19/2016   AST 16 06/19/2016   NA 139 06/19/2016   K 3.9 06/19/2016   CL 105 06/19/2016   CREATININE 1.01 06/19/2016   BUN 22 06/19/2016   CO2 27 06/19/2016   TSH 0.861 10/31/2021    No results found.     Assessment & Plan:  Right-sided low back pain without sciatica, unspecified chronicity Assessment & Plan: Low back pain as outlined.  Has been an issue previously.  Doing stretches/exercise.  Would like to avoid medication.  Refer to PT for further evaluation and treatment.    Orders: -     Ambulatory referral to Physical Therapy  Sensation of fullness in both ears Assessment & Plan: Ear fullness  and some  perceived change in hearing as outlined.  No increased congestion. No infection.  Given persistent fullness and hearing change, will refer to ENT for further evaluation.    Orders: -  Ambulatory referral to ENT  Moderate episode of recurrent major depressive disorder [facility]) Assessment & Plan: Doing better.  Feeling better.  Continue lexapro.  F/u with therapy.    Other orders -     Escitalopram Oxalate; Take 1 tablet (20 mg total) by mouth daily.  Dispense: 90 tablet; Refill: 1     Einar Pheasant, MD

## 2022-12-24 ENCOUNTER — Encounter: Payer: Self-pay | Admitting: Internal Medicine

## 2022-12-24 NOTE — Assessment & Plan Note (Signed)
Low back pain as outlined.  Has been an issue previously.  Doing stretches/exercise.  Would like to avoid medication.  Refer to PT for further evaluation and treatment.

## 2022-12-24 NOTE — Assessment & Plan Note (Signed)
Ear fullness  and some perceived change in hearing as outlined.  No increased congestion. No infection.  Given persistent fullness and hearing change, will refer to ENT for further evaluation.

## 2022-12-24 NOTE — Assessment & Plan Note (Addendum)
Doing better.  Feeling better.  Continue lexapro.  F/u with therapy.

## 2022-12-25 ENCOUNTER — Ambulatory Visit: Payer: BC Managed Care – PPO | Admitting: Internal Medicine

## 2023-01-14 ENCOUNTER — Encounter: Payer: Self-pay | Admitting: Internal Medicine

## 2023-01-22 ENCOUNTER — Ambulatory Visit
Admission: EM | Admit: 2023-01-22 | Discharge: 2023-01-22 | Disposition: A | Discharge status: Home / Self Care | Payer: BC Managed Care – PPO

## 2023-01-22 ENCOUNTER — Telehealth: Payer: Self-pay | Admitting: Internal Medicine

## 2023-01-22 DIAGNOSIS — R1032 Left lower quadrant pain: Secondary | ICD-10-CM

## 2023-01-22 NOTE — ED Triage Notes (Signed)
Patient presents to [facility] for abdominal pain x 3 days. Not taking any OTC meds. States no concerns for STDs or pregnancy.   Denies fever, n/v, diarrhea, or urinary symptoms.

## 2023-01-22 NOTE — ED Provider Notes (Signed)
[name]    CSN: [id number] Arrival date & time: 01/22/23  1736      History   Chief Complaint Chief Complaint  Patient presents with   Abdominal Pain    HPI NEYSA ARTS is a 27 y.o. female.    Abdominal Pain   Presents to urgent care with 3 days of abdominal pain.  She states no concern for STDs or pregnancy.  Patient is breast-feeding since her delivery in 2023.  She states she is currently weaning from breast-feeding.  Is not menstruating.  Past Medical History:  Diagnosis Date   Anxiety    Chest pain    Depression    Frequent headaches    cluster headaches.  had MRI.    Hx of migraines     Patient Active Problem List   Diagnosis Date Noted   Low back pain 12/20/2022   Ear fullness 12/20/2022   Vomiting 12/01/2022   Sinusitis 10/14/2022   Conjunctivitis 10/14/2022   Encounter for completion of form with patient 07/21/2022   Tonsil stone 07/21/2022   Chest tightness 04/11/2020   Keloid scar of skin 11/20/2017   Moderate episode of recurrent major depressive disorder 11/19/2017   Viral upper respiratory infection 11/07/2016   Weight gain 10/07/2016   Nausea without vomiting 06/19/2016   Rash 11/21/2015   Health care maintenance 11/21/2015   Depression 11/15/2015   Headache 11/15/2015   Stress 11/15/2015   Hearing loss 11/15/2015    Past Surgical History:  Procedure Laterality Date   MOUTH SURGERY  2013    OB History     Gravida  1   Para  0   Term  0   Preterm  0   AB  0   Living  0      SAB  0   IAB  0   Ectopic  0   Multiple  0   Live Births  0            Home Medications    Prior to Admission medications   Medication Sig Start Date End Date Taking? Authorizing Provider  escitalopram (LEXAPRO) 20 MG tablet Take 1 tablet (20 mg total) by mouth daily. 12/20/22   Dale Helper, MD    Family History Family History  Problem Relation Age of Onset   Heart disease Maternal Grandfather     Hypertension Maternal Grandfather    Heart attack Maternal Grandfather    Alcohol abuse Paternal Grandfather    Hypertension Paternal Grandfather    Early death Paternal Grandfather    Heart attack Paternal Grandfather     Social History Social History   Tobacco Use   Smoking status: Never   Smokeless tobacco: Never  Vaping Use   Vaping Use: Never used  Substance Use Topics   Alcohol use: Not Currently   Drug use: Never     Allergies   Oxycodone   Review of Systems Review of Systems  Gastrointestinal:  Positive for abdominal pain.     Physical Exam Triage Vital Signs ED Triage Vitals  Enc Vitals Group     BP      Pulse      Resp      Temp      Temp src      SpO2      Weight      Height      Head Circumference      Peak Flow      Pain Score  Pain Loc      Pain Edu?      Excl. in GC?    No data found.  Updated Vital Signs Breastfeeding Yes   Visual Acuity Right Eye Distance:   Left Eye Distance:   Bilateral Distance:    Right Eye Near:   Left Eye Near:    Bilateral Near:     Physical Exam Vitals reviewed.  Constitutional:      Appearance: She is well-developed.  Cardiovascular:     Rate and Rhythm: Normal rate and regular rhythm.  Abdominal:     General: Bowel sounds are normal.     Palpations: Abdomen is soft.     Tenderness: There is abdominal tenderness in the left lower quadrant.  Skin:    General: Skin is warm and dry.  Neurological:     Mental Status: She is alert.      UC Treatments / Results  Labs (all labs ordered are listed, but only abnormal results are displayed) Labs Reviewed - No data to display  EKG   Radiology No results found.  Procedures Procedures (including critical care time)  Medications Ordered in UC Medications - No data to display  Initial Impression / Assessment and Plan / UC Course  I have reviewed the triage vital signs and the nursing notes.  Pertinent labs & imaging results that were  available during my care of the patient were reviewed by me and considered in my medical decision making (see chart for details).   Nayda IVYONNA HOELZEL is a 27 y.o. female presenting with LLQ pain. Patient is afebrile without recent antipyretics, satting well on room air. Overall is well appearing and non-toxic, well hydrated, without respiratory distress. Abdomen is soft with left lower quadrant tenderness.  Also endorses some referred tenderness with palpation to her right lower quadrant.  Reviewed differential diagnoses with patient including diverticulitis (less likely) and left adnexal pain, possibly ovarian.  Patient is weaning from breast-feeding and possibly preparing to menstruate.  Possible ovarian cyst.  Informed patient that our ability to positively diagnose this and other abdominal problems is limited due to lack of imaging availability at this site.  Recommended that she watch and wait and go to the ED if she developed worsening pain, or other concerning symptoms such as heavy menstrual bleeding.  Final Clinical Impressions(s) / UC Diagnoses   Final diagnoses:  None   Discharge Instructions   None    ED Prescriptions   None    PDMP not reviewed this encounter.   Charma Igo, [place] 01/22/23 1910

## 2023-01-22 NOTE — Discharge Instructions (Signed)
Follow up here or with your primary care provider if your symptoms are worsening or not improving.     

## 2023-01-22 NOTE — Telephone Encounter (Signed)
Pt called stating she is having abdominal pain on the left side in the center for three days sent to access nurse

## 2023-01-23 NOTE — Telephone Encounter (Signed)
FYI patient was evaluated at urgent care. Will follow up

## 2023-01-24 ENCOUNTER — Ambulatory Visit: Payer: BC Managed Care – PPO | Admitting: Internal Medicine

## 2023-02-06 ENCOUNTER — Ambulatory Visit: Payer: BC Managed Care – PPO | Admitting: Licensed Practical Nurse

## 2023-02-06 ENCOUNTER — Encounter: Payer: Self-pay | Admitting: Licensed Practical Nurse

## 2023-02-06 VITALS — BP 113/80 | HR 71 | Ht 59.0 in | Wt 153.0 lb

## 2023-02-06 DIAGNOSIS — R1032 Left lower quadrant pain: Secondary | ICD-10-CM | POA: Diagnosis not present

## 2023-02-06 DIAGNOSIS — R109 Unspecified abdominal pain: Secondary | ICD-10-CM

## 2023-02-07 NOTE — Progress Notes (Signed)
SUBJECTIVE: Here for evaluation for left sided pain and to establish care Kelly Eaton has had pain on her left side that started on April 22. The pain is worse when it is touched or when she is bending over. Denies fevers, N/V/D. She has used any medication for the pain. She is currently breastfeeding (c/s June 2023) She has not yet had a cycle. She is not on any hormones. Reports her bowel habits are "normal" goes twice a day.   She did go Urgent Care on 4/23, was told it was probably a cyst and she should follow up with a provider. Kelly Eaton feels the pain is improving. She kept the appointment because she would like to establish care, they recently moved back to the area.   OBJECTIVE: BP 113/80   Pulse 71   Ht 4\' 11"  (1.499 m)   Wt 153 lb (69.4 kg)   BMI 30.90 kg/m  Gen: Nad Abd: flat, no tenderness Bimanual exam: uterus non gravid, non tender no masses, adnexa not enlarged, no masses, some tenderness over left lower side.   ASSESSMENT: Possible ovarian cyst  PLAN: -reviewed normal exam, she could have an ovarian cyst, typically treatment is not required. [place] offered, pt prefers to wait, will call if she desires an [place] -will return at a later date for annual exam  Jannifer Hick  [facility]  02/07/23  11:00 AM

## 2023-02-20 ENCOUNTER — Telehealth: Payer: BC Managed Care – PPO | Admitting: Nurse Practitioner

## 2023-02-20 DIAGNOSIS — J40 Bronchitis, not specified as acute or chronic: Secondary | ICD-10-CM

## 2023-02-20 MED ORDER — BENZONATATE 100 MG PO CAPS: 100.0000 mg | ORAL_CAPSULE | Freq: Three times a day (TID) | ORAL | 0 refills | Status: DC | PRN

## 2023-02-20 MED ORDER — AZITHROMYCIN 250 MG PO TABS: ORAL_TABLET | ORAL | 0 refills | Status: AC

## 2023-02-20 NOTE — Progress Notes (Signed)
E-Visit for Cough  We are sorry that you are not feeling well.  Here is how we plan to help!  Based on your presentation I believe you most likely have A cough due to bacteria.  When patients have a fever and a productive cough with a change in color or increased sputum production, we are concerned about bacterial bronchitis.  If left untreated it can progress to pneumonia.  If your symptoms do not improve with your treatment plan it is important that you contact your provider.   I have prescribed Azithromyin 250 mg: two tablets now and then one tablet daily for 4 additonal days    In addition you may use A prescription cough medication called Tessalon Perles 100mg. You may take 1-2 capsules every 8 hours as needed for your cough.   From your responses in the eVisit questionnaire you describe inflammation in the upper respiratory tract which is causing a significant cough.  This is commonly called Bronchitis and has four common causes:   Allergies Viral Infections Acid Reflux Bacterial Infection Allergies, viruses and acid reflux are treated by controlling symptoms or eliminating the cause. An example might be a cough caused by taking certain blood pressure medications. You stop the cough by changing the medication. Another example might be a cough caused by acid reflux. Controlling the reflux helps control the cough.  USE OF BRONCHODILATOR ("RESCUE") INHALERS: There is a risk from using your bronchodilator too frequently.  The risk is that over-reliance on a medication which only relaxes the muscles surrounding the breathing tubes can reduce the effectiveness of medications prescribed to reduce swelling and congestion of the tubes themselves.  Although you feel brief relief from the bronchodilator inhaler, your asthma may actually be worsening with the tubes becoming more swollen and filled with mucus.  This can delay other crucial treatments, such as oral steroid medications. If you need to use a  bronchodilator inhaler daily, several times per day, you should discuss this with your provider.  There are probably better treatments that could be used to keep your asthma under control.     HOME CARE Only take medications as instructed by your medical team. Complete the entire course of an antibiotic. Drink plenty of fluids and get plenty of rest. Avoid close contacts especially the very young and the elderly Cover your mouth if you cough or cough into your sleeve. Always remember to wash your hands A steam or ultrasonic humidifier can help congestion.   GET HELP RIGHT AWAY IF: You develop worsening fever. You become short of breath You cough up blood. Your symptoms persist after you have completed your treatment plan MAKE SURE YOU  Understand these instructions. Will watch your condition. Will get help right away if you are not doing well or get worse.    Thank you for choosing an e-visit.  Your e-visit answers were reviewed by a board certified advanced clinical practitioner to complete your personal care plan. Depending upon the condition, your plan could have included both over the counter or prescription medications.  Please review your pharmacy choice. Make sure the pharmacy is open so you can pick up prescription now. If there is a problem, you may contact your provider through MyChart messaging and have the prescription routed to another pharmacy.  Your safety is important to us. If you have drug allergies check your prescription carefully.   For the next 24 hours you can use MyChart to ask questions about today's visit, request a non-urgent   call back, or ask for a work or school excuse. You will get an email in the next two days asking about your experience. I hope that your e-visit has been valuable and will speed your recovery.   Meds ordered this encounter  Medications   azithromycin (ZITHROMAX) 250 MG tablet    Sig: Take 2 tablets on day 1, then 1 tablet daily on  days 2 through 5    Dispense:  6 tablet    Refill:  0   benzonatate (TESSALON) 100 MG capsule    Sig: Take 1 capsule (100 mg total) by mouth 3 (three) times daily as needed.    Dispense:  30 capsule    Refill:  0    I spent approximately 5 minutes reviewing the patient's history, current symptoms and coordinating their care today.   

## 2023-03-13 ENCOUNTER — Ambulatory Visit: Payer: BC Managed Care – PPO | Admitting: Internal Medicine

## 2023-03-21 ENCOUNTER — Ambulatory Visit: Payer: BC Managed Care – PPO | Admitting: Internal Medicine

## 2023-03-21 VITALS — BP 112/74 | HR 89 | Temp 98.0°F | Resp 16 | Ht <= 58 in | Wt 153.4 lb

## 2023-03-21 DIAGNOSIS — R1032 Left lower quadrant pain: Secondary | ICD-10-CM | POA: Diagnosis not present

## 2023-03-21 DIAGNOSIS — F419 Anxiety disorder, unspecified: Secondary | ICD-10-CM | POA: Diagnosis not present

## 2023-03-21 DIAGNOSIS — M25531 Pain in right wrist: Secondary | ICD-10-CM | POA: Insufficient documentation

## 2023-03-21 MED ORDER — ESCITALOPRAM OXALATE 20 MG PO TABS: 20.0000 mg | ORAL_TABLET | Freq: Every day | ORAL | 0 refills | Status: DC

## 2023-03-21 NOTE — Progress Notes (Addendum)
Subjective:    Patient ID: Kelly Eaton, female    DOB: 24-Oct-1995, 27 y.o.   MRN: [id number]  Patient here for  Chief Complaint  Patient presents with   Wrist Pain    HPI Work in appt for wrist pain.  Has been present for months.  Noticed after the birth of her son.  Does a lot of writing at work.  Also aggravated by lifting her son.  Has tried wrist splint and tylenol/ibuprofen.  Still with increased pain.  Affecting her activity.  Also with increased anxiety. Discussed. Currently taking lexapro. Discussed counseling/referral to psychiatry.  Agreeable.  Also reports noticing - LLQ pain.  Notices with time of ovulation.  No pain now.     Past Medical History:  Diagnosis Date   Anxiety    Chest pain    Depression    Frequent headaches    cluster headaches.  had MRI.    Hx of migraines    Past Surgical History:  Procedure Laterality Date   CESAREAN SECTION     MOUTH SURGERY  10/02/2011   Family History  Problem Relation Age of Onset   Heart disease Maternal Grandfather    Hypertension Maternal Grandfather    Heart attack Maternal Grandfather    Alcohol abuse Paternal Grandfather    Hypertension Paternal Grandfather    Early death Paternal Grandfather    Heart attack Paternal Grandfather    Social History   Socioeconomic History   Marital status: Married    Spouse name: Not on file   Number of children: Not on file   Years of education: Not on file   Highest education level: Bachelor's degree (e.g., BA, AB, BS)  Occupational History   Not on file  Tobacco Use   Smoking status: Never   Smokeless tobacco: Never  Vaping Use   Vaping Use: Never used  Substance and Sexual Activity   Alcohol use: Not Currently   Drug use: Never   Sexual activity: Yes    Birth control/protection: OCP  Other Topics Concern   Not on file  Social History Narrative   Not on file   Social Determinants of Health   Financial Resource Strain: Low Risk  (03/20/2023)   Overall  Financial Resource Strain (CARDIA)    Difficulty of Paying Living Expenses: Not hard at all  Food Insecurity: No Food Insecurity (03/20/2023)   Hunger Vital Sign    Worried About Running Out of Food in the Last Year: Never true    Ran Out of Food in the Last Year: Never true  Transportation Needs: No Transportation Needs (03/20/2023)   PRAPARE - Administrator, Civil Service (Medical): No    Lack of Transportation (Non-Medical): No  Physical Activity: Sufficiently Active (03/20/2023)   Exercise Vital Sign    Days of Exercise per Week: 5 days    Minutes of Exercise per Session: 30 min  Stress: Stress Concern Present (03/20/2023)   [employer] of Occupational Health - Occupational Stress Questionnaire    Feeling of Stress : Very much  Social Connections: Moderately Isolated (03/20/2023)   Social Connection and Isolation Panel [NHANES]    Frequency of Communication with Friends and Family: More than three times a week    Frequency of Social Gatherings with Friends and Family: More than three times a week    Attends Religious Services: Never    Database administrator or Organizations: No    Attends Banker Meetings: Not  on file    Marital Status: Married     Review of Systems  Constitutional:  Negative for appetite change and unexpected weight change.  HENT:  Negative for congestion and sinus pressure.   Respiratory:  Negative for cough, chest tightness and shortness of breath.   Cardiovascular:  Negative for chest pain, palpitations and leg swelling.  Gastrointestinal:  Negative for abdominal pain, diarrhea, nausea and vomiting.  Genitourinary:  Negative for difficulty urinating and dysuria.  Musculoskeletal:  Negative for myalgias.       Wrist pain as outlined.   Skin:  Negative for color change and rash.  Neurological:  Negative for dizziness and headaches.  Psychiatric/Behavioral:  Negative for agitation and dysphoric mood.        Increased anxiety.         Objective:     BP 112/74   Pulse 89   Temp 98 F (36.7 C)   Resp 16   Ht 4\' 10"  (1.473 m)   Wt 153 lb 6.4 oz (69.6 kg)   SpO2 99%   BMI 32.06 kg/m  Wt Readings from Last 3 Encounters:  03/21/23 153 lb 6.4 oz (69.6 kg)  02/06/23 153 lb (69.4 kg)  12/24/22 151 lb 12.8 oz (68.9 kg)    Physical Exam Vitals reviewed.  Constitutional:      General: She is not in acute distress.    Appearance: Normal appearance.  HENT:     Head: Normocephalic and atraumatic.     Right Ear: External ear normal.     Left Ear: External ear normal.  Eyes:     General: No scleral icterus.       Right eye: No discharge.        Left eye: No discharge.     Conjunctiva/sclera: Conjunctivae normal.  Neck:     Thyroid: No thyromegaly.  Cardiovascular:     Rate and Rhythm: Normal rate and regular rhythm.  Pulmonary:     Effort: No respiratory distress.     Breath sounds: Normal breath sounds. No wheezing.  Abdominal:     General: Bowel sounds are normal.     Palpations: Abdomen is soft.     Tenderness: There is no abdominal tenderness.  Musculoskeletal:     Cervical back: Neck supple. No tenderness.     Comments: Increased tenderness - right wrist.  Increased pain with resistance - flexion of thumb.   Lymphadenopathy:     Cervical: No cervical adenopathy.  Skin:    Findings: No erythema or rash.  Neurological:     Mental Status: She is alert.  Psychiatric:        Mood and Affect: Mood normal.        Behavior: Behavior normal.      Outpatient Encounter Medications as of 03/21/2023  Medication Sig   escitalopram (LEXAPRO) 20 MG tablet Take 1 tablet (20 mg total) by mouth daily.   [DISCONTINUED] benzonatate (TESSALON) 100 MG capsule Take 1 capsule (100 mg total) by mouth 3 (three) times daily as needed.   [DISCONTINUED] escitalopram (LEXAPRO) 20 MG tablet Take 1 tablet (20 mg total) by mouth daily.   No facility-administered encounter medications on file as of 03/21/2023.      Lab Results  Component Value Date   WBC 7.5 06/19/2016   HGB 13.1 07/31/2021   HCT 39 07/31/2021   PLT 274 07/31/2021   GLUCOSE 79 06/19/2016   CHOL 146 11/15/2015   TRIG 79.0 11/15/2015   HDL 54.90 11/15/2015  LDLCALC 76 11/15/2015   ALT 13 06/19/2016   AST 16 06/19/2016   NA 139 06/19/2016   K 3.9 06/19/2016   CL 105 06/19/2016   CREATININE 1.01 06/19/2016   BUN 22 06/19/2016   CO2 27 06/19/2016   TSH 0.861 10/31/2021       Assessment & Plan:  Right wrist pain -     Ambulatory referral to Orthopedics  Wrist pain, right Assessment & Plan: Persistent wrist pain.  Has tried splint.  Tried otc pain medication.  No improvement.  Given persistent pain, will refer to Dr Stephenie Acres.     Anxiety Assessment & Plan: Currently on lexapro.  Increased anxiety.  Discussed.  Agreeable to referral to psychiatry for further evaluation and treatment.   Orders: -     Ambulatory referral to Psychiatry  LLQ pain Assessment & Plan: LLQ pain - noticed with ovulation.  No pain now.  Discussed further evaluation.  Discussed pelvic ultrasound.  Will follow.  Notify me if recurrence or persistent.    Other orders -     Escitalopram Oxalate; Take 1 tablet (20 mg total) by mouth daily.  Dispense: 90 tablet; Refill: 0     Dale [place], MD

## 2023-03-22 ENCOUNTER — Encounter: Payer: BC Managed Care – PPO | Admitting: Internal Medicine

## 2023-03-23 ENCOUNTER — Encounter: Payer: Self-pay | Admitting: Internal Medicine

## 2023-03-23 DIAGNOSIS — R1032 Left lower quadrant pain: Secondary | ICD-10-CM | POA: Insufficient documentation

## 2023-03-23 NOTE — Assessment & Plan Note (Signed)
Persistent wrist pain.  Has tried splint.  Tried otc pain medication.  No improvement.  Given persistent pain, will refer to Dr Stephenie Acres.

## 2023-03-23 NOTE — Addendum Note (Signed)
Addended by: Charm Barges on: 03/23/2023 07:24 PM   Modules accepted: Level of Service

## 2023-03-23 NOTE — Assessment & Plan Note (Signed)
Currently on lexapro.  Increased anxiety.  Discussed.  Agreeable to referral to psychiatry for further evaluation and treatment.

## 2023-03-23 NOTE — Assessment & Plan Note (Signed)
LLQ pain - noticed with ovulation.  No pain now.  Discussed further evaluation.  Discussed pelvic ultrasound.  Will follow.  Notify me if recurrence or persistent.

## 2023-04-25 ENCOUNTER — Encounter: Payer: Self-pay | Admitting: Internal Medicine

## 2023-04-25 ENCOUNTER — Ambulatory Visit: Payer: BC Managed Care – PPO | Admitting: Internal Medicine

## 2023-04-25 VITALS — BP 120/70 | HR 75 | Temp 98.0°F | Resp 16 | Ht 59.0 in | Wt 157.0 lb

## 2023-04-25 DIAGNOSIS — F331 Major depressive disorder, recurrent, moderate: Secondary | ICD-10-CM | POA: Diagnosis not present

## 2023-04-25 DIAGNOSIS — R1032 Left lower quadrant pain: Secondary | ICD-10-CM

## 2023-04-25 DIAGNOSIS — F32A Depression, unspecified: Secondary | ICD-10-CM

## 2023-04-25 DIAGNOSIS — Z0000 Encounter for general adult medical examination without abnormal findings: Secondary | ICD-10-CM | POA: Diagnosis not present

## 2023-04-25 DIAGNOSIS — F419 Anxiety disorder, unspecified: Secondary | ICD-10-CM

## 2023-04-25 NOTE — Progress Notes (Signed)
Subjective:    Patient ID: Kelly Eaton, female    DOB: December 05, 1995, 27 y.o.   MRN: [id number]  Patient here for  Chief Complaint  Patient presents with   Annual Exam    HPI Here for follow up.  Recently referred to psychiatry - increased stress/anxiety.  On lexapro.  Increased stress with her work.  Concerned regarding her job.  Discussed.  Has appt with psychiatry in 06/2023.  Will d/w psychiatry regarding earlier appt.  Recent wrist pain - referred to Dr Stephenie Acres.   S/p injection this week.  Feeling better.  Recently evaluated - left side pain.  Evaluated by gyn.  Elected to monitor. Wanted to hold on pap smear today.  LMP 04/17/23 - 04/21/23.    Past Medical History:  Diagnosis Date   Anxiety    Chest pain    Depression    Frequent headaches    cluster headaches.  had MRI.    Hx of migraines    Past Surgical History:  Procedure Laterality Date   CESAREAN SECTION     MOUTH SURGERY  10/02/2011   Family History  Problem Relation Age of Onset   Heart disease Maternal Grandfather    Hypertension Maternal Grandfather    Heart attack Maternal Grandfather    Alcohol abuse Paternal Grandfather    Hypertension Paternal Grandfather    Early death Paternal Grandfather    Heart attack Paternal Grandfather    Social History   Socioeconomic History   Marital status: Married    Spouse name: Not on file   Number of children: Not on file   Years of education: Not on file   Highest education level: Bachelor's degree (e.g., BA, AB, BS)  Occupational History   Not on file  Tobacco Use   Smoking status: Never   Smokeless tobacco: Never  Vaping Use   Vaping status: Never Used  Substance and Sexual Activity   Alcohol use: Not Currently   Drug use: Never   Sexual activity: Yes    Birth control/protection: OCP  Other Topics Concern   Not on file  Social History Narrative   Not on file   Social Determinants of Health   Financial Resource Strain: Low Risk  (03/20/2023)   Overall  Financial Resource Strain (CARDIA)    Difficulty of Paying Living Expenses: Not hard at all  Food Insecurity: No Food Insecurity (03/20/2023)   Hunger Vital Sign    Worried About Running Out of Food in the Last Year: Never true    Ran Out of Food in the Last Year: Never true  Transportation Needs: No Transportation Needs (03/20/2023)   PRAPARE - Administrator, Civil Service (Medical): No    Lack of Transportation (Non-Medical): No  Physical Activity: Sufficiently Active (03/20/2023)   Exercise Vital Sign    Days of Exercise per Week: 5 days    Minutes of Exercise per Session: 30 min  Stress: Stress Concern Present (03/20/2023)   [employer] of Occupational Health - Occupational Stress Questionnaire    Feeling of Stress : Very much  Social Connections: Moderately Isolated (03/20/2023)   Social Connection and Isolation Panel [NHANES]    Frequency of Communication with Friends and Family: More than three times a week    Frequency of Social Gatherings with Friends and Family: More than three times a week    Attends Religious Services: Never    Database administrator or Organizations: No    Attends Club or  Organization Meetings: Not on file    Marital Status: Married     Review of Systems  Constitutional:  Negative for appetite change and unexpected weight change.  HENT:  Negative for congestion and sinus pressure.   Respiratory:  Negative for cough, chest tightness and shortness of breath.   Cardiovascular:  Negative for chest pain, palpitations and leg swelling.  Gastrointestinal:  Negative for diarrhea, nausea and vomiting.       No increased abdominal pain.  Minimal LLQ pain.   Genitourinary:  Negative for difficulty urinating and dysuria.  Musculoskeletal:  Negative for joint swelling and myalgias.  Skin:  Negative for color change and rash.  Neurological:  Negative for dizziness and headaches.  Psychiatric/Behavioral:         Increased stress as outlined. No SI.         Objective:     BP 120/70   Pulse 75   Temp 98 F (36.7 C)   Resp 16   Ht 4\' 11"  (1.499 m)   Wt 157 lb (71.2 kg)   SpO2 99%   BMI 31.71 kg/m  Wt Readings from Last 3 Encounters:  04/25/23 157 lb (71.2 kg)  03/21/23 153 lb 6.4 oz (69.6 kg)  02/06/23 153 lb (69.4 kg)    Physical Exam Vitals reviewed.  Constitutional:      General: She is not in acute distress.    Appearance: Normal appearance. She is well-developed.  HENT:     Head: Normocephalic and atraumatic.     Right Ear: External ear normal.     Left Ear: External ear normal.  Eyes:     General: No scleral icterus.       Right eye: No discharge.        Left eye: No discharge.     Conjunctiva/sclera: Conjunctivae normal.  Neck:     Thyroid: No thyromegaly.  Cardiovascular:     Rate and Rhythm: Normal rate and regular rhythm.  Pulmonary:     Effort: No tachypnea, accessory muscle usage or respiratory distress.     Breath sounds: Normal breath sounds. No decreased breath sounds or wheezing.  Chest:  Breasts:    Right: No inverted nipple, mass, nipple discharge or tenderness (no axillary adenopathy).     Left: No inverted nipple, mass, nipple discharge or tenderness (no axilarry adenopathy).  Abdominal:     General: Bowel sounds are normal.     Palpations: Abdomen is soft.     Comments: No increased tenderness to palpation.   Genitourinary:    Comments: Request to hold on pap smear.  Musculoskeletal:        General: No swelling or tenderness.     Cervical back: Neck supple.  Lymphadenopathy:     Cervical: No cervical adenopathy.  Skin:    Findings: No erythema or rash.  Neurological:     Mental Status: She is alert and oriented to person, place, and time.  Psychiatric:        Mood and Affect: Mood normal.        Behavior: Behavior normal.      Outpatient Encounter Medications as of 04/25/2023  Medication Sig   escitalopram (LEXAPRO) 20 MG tablet Take 1 tablet (20 mg total) by mouth daily.    norethindrone (MICRONOR) 0.35 MG tablet Take 1 tablet by mouth daily.   No facility-administered encounter medications on file as of 04/25/2023.     Lab Results  Component Value Date   WBC 7.5 06/19/2016  HGB 13.1 07/31/2021   HCT 39 07/31/2021   PLT 274 07/31/2021   GLUCOSE 79 06/19/2016   CHOL 146 11/15/2015   TRIG 79.0 11/15/2015   HDL 54.90 11/15/2015   LDLCALC 76 11/15/2015   ALT 13 06/19/2016   AST 16 06/19/2016   NA 139 06/19/2016   K 3.9 06/19/2016   CL 105 06/19/2016   CREATININE 1.01 06/19/2016   BUN 22 06/19/2016   CO2 27 06/19/2016   TSH 0.861 10/31/2021       Assessment & Plan:  Routine general medical examination at a health care facility  Anxiety Assessment & Plan: Currently on lexapro.  Increased anxiety.  Discussed. Has appt in 06/2023 with psychiatry.  No SI.  Will see if can get an earlier appt.  Follow.  Has good support.    Depression, unspecified depression type Assessment & Plan: Continues on lexapro.  No SI.  D/w psychiatry regarding earlier appt.  Has good support.    Health care maintenance Assessment & Plan: Physical today 04/25/23.  Wanted to hold on pap smear today.  PAP next visit.     LLQ pain Assessment & Plan:  Recently evaluated - left side pain.  Evaluated by gyn.  Elected to monitor. Discussed today.  Discussed f/u / pelvic ultrasound.  Wants to monitor.     Moderate episode of recurrent major depressive disorder [facility]) Assessment & Plan: Discussed current stress as outlined. Continue lexapro.  Earlier appt with psych.       Dale Tetlin, MD

## 2023-04-27 ENCOUNTER — Telehealth: Payer: Self-pay | Admitting: Internal Medicine

## 2023-04-27 ENCOUNTER — Encounter: Payer: Self-pay | Admitting: Internal Medicine

## 2023-04-27 NOTE — Telephone Encounter (Signed)
Please call and confirm Ceana is doing ok.  Also, let her know that I did contact psychiatry (Dr Maryruth Bun) and they should be contacting her regarding an earlier appt.  Let me know if any problems.

## 2023-04-27 NOTE — Assessment & Plan Note (Signed)
Physical today 04/25/23.  Wanted to hold on pap smear today.  PAP next visit.

## 2023-04-27 NOTE — Assessment & Plan Note (Signed)
Continues on lexapro.  No SI.  D/w psychiatry regarding earlier appt.  Has good support.

## 2023-04-27 NOTE — Assessment & Plan Note (Signed)
Currently on lexapro.  Increased anxiety.  Discussed. Has appt in 06/2023 with psychiatry.  No SI.  Will see if can get an earlier appt.  Follow.  Has good support.

## 2023-04-27 NOTE — Assessment & Plan Note (Signed)
Recently evaluated - left side pain.  Evaluated by gyn.  Elected to monitor. Discussed today.  Discussed f/u / pelvic ultrasound.  Wants to monitor.

## 2023-04-27 NOTE — Assessment & Plan Note (Signed)
Discussed current stress as outlined. Continue lexapro.  Earlier appt with psych.

## 2023-04-29 NOTE — Telephone Encounter (Signed)
Patient is aware of below. Confirmed doing ok

## 2023-05-29 ENCOUNTER — Encounter: Payer: Self-pay | Admitting: Internal Medicine

## 2023-05-30 ENCOUNTER — Encounter: Payer: Self-pay | Admitting: Nurse Practitioner

## 2023-05-30 ENCOUNTER — Telehealth: Payer: BC Managed Care – PPO | Admitting: Nurse Practitioner

## 2023-05-30 VITALS — Temp 97.9°F | Wt 157.0 lb

## 2023-05-30 DIAGNOSIS — U071 COVID-19: Secondary | ICD-10-CM

## 2023-05-30 MED ORDER — PREDNISONE 10 MG PO TABS
ORAL_TABLET | ORAL | 0 refills | Status: DC
Start: 2023-05-30 — End: 2023-07-26

## 2023-05-30 MED ORDER — MOLNUPIRAVIR EUA 200MG CAPSULE: 4.0000 | ORAL_CAPSULE | Freq: Two times a day (BID) | ORAL | 0 refills | Status: AC

## 2023-05-30 NOTE — Progress Notes (Signed)
Virtual Visit via Video Note  I connected with LASHUNDRA ORLOFF on 05/30/2023 at 4:15 PM  by a video enabled telemedicine application and verified that I am speaking with the correct person using two identifiers.  Patient Location: Home Provider Location: Office/Clinic  I discussed the limitations, risks, security, and privacy concerns of performing an evaluation and management service by video and the availability of in person appointments. I also discussed with the patient that there may be a patient responsible charge related to this service. The patient expressed understanding and agreed to proceed.  Subjective: PCP: Dale Altura, MD  Chief Complaint  Patient presents with   Covid Positive    Runny nose, cough, sinus pressure body aches   HPI Patient is seen due to positive COVID home test yesterday. Son tested positive Tuesday  The associated symptoms includes RN, body aches, sinus pressure and some cough. Denise  SOB, chest pain    ROS: Per HPI  Current Outpatient Medications:    EFFEXOR XR 75 MG 24 hr capsule, Take 75 mg by mouth., Disp: , Rfl:    escitalopram (LEXAPRO) 20 MG tablet, Take 1 tablet (20 mg total) by mouth daily., Disp: 90 tablet, Rfl: 0   predniSONE (DELTASONE) 10 MG tablet, Take 4 tablets ( total 40 mg) by mouth for 2 days; take 3 tablets ( total 30 mg) by mouth for 2 days; take 2 tablets ( total 20 mg) by mouth for 1 day; take 1 tablet ( total 10 mg) by mouth for 1 day., Disp: 17 tablet, Rfl: 0   norethindrone (MICRONOR) 0.35 MG tablet, Take 1 tablet by mouth daily. (Patient not taking: Reported on 05/30/2023), Disp: , Rfl:   Observations/Objective: Today's Vitals   05/30/23 1611  Temp: 97.9 F (36.6 C)  TempSrc: Oral  Weight: 157 lb (71.2 kg)   Physical Exam Constitutional:      General: She is not in acute distress.    Appearance: Normal appearance.  Pulmonary:     Effort: No respiratory distress.  Neurological:     Mental Status: She is  alert and oriented to person, place, and time. Mental status is at baseline.  Psychiatric:        Speech: Speech normal.        Behavior: Behavior normal.        Thought Content: Thought content normal.     Assessment and Plan: COVID Assessment & Plan: Positive COVID home. Will treat with molnupiravir and prednisone tapering. Advised patient to take Tylenol or ibuprofen for fever and pain control. Increase fluid intake and rest.    Orders: -     predniSONE; Take 4 tablets ( total 40 mg) by mouth for 2 days; take 3 tablets ( total 30 mg) by mouth for 2 days; take 2 tablets ( total 20 mg) by mouth for 1 day; take 1 tablet ( total 10 mg) by mouth for 1 day.  Dispense: 17 tablet; Refill: 0  Other orders -     molnupiravir EUA; Take 4 capsules (800 mg total) by mouth 2 (two) times daily for 5 days.  Dispense: 40 capsule; Refill: 0    Follow Up Instructions: No follow-ups on file.   I discussed the assessment and treatment plan with the patient. The patient was provided an opportunity to ask questions, and all were answered. The patient agreed with the plan and demonstrated an understanding of the instructions.   The patient was advised to call back or seek an in-person  evaluation if the symptoms worsen or if the condition fails to improve as anticipated.  The above assessment and management plan was discussed with the patient. The patient verbalized understanding of and has agreed to the management plan.   Kara Dies, NP

## 2023-05-31 ENCOUNTER — Encounter: Payer: Self-pay | Admitting: Nurse Practitioner

## 2023-06-02 ENCOUNTER — Telehealth: Payer: BC Managed Care – PPO | Admitting: Emergency Medicine

## 2023-06-02 DIAGNOSIS — U071 COVID-19: Secondary | ICD-10-CM

## 2023-06-02 NOTE — Progress Notes (Signed)
I am not able to prescribe antivirals for COVID through evisit.   The two antivirals for covid are molnupiravir and paxlovid. Both are very expensive out of pocket and can still be expensive if you have insurance, depending on your insurance coverage.   If you would like to be prescribed paxlovid, you will need to be seen in person, such as at an urgent care. Keep in mind, it's recommended that you start antivirals for covid within 3-5 days at the most. You are approaching the outer limit for antiviral medicine effectiveness.   Kind regards,  Rica Mast, PhD, FNP-BC [facility]   NOTE: There will be NO CHARGE for this eVisit   If you are having a true medical emergency please call 911.      For an urgent face to face visit, [facility] has eight urgent care centers for your convenience:   NEW!! [facility] Health Urgent Care Center at [facility] Get Driving Directions [phone] [address] [place], [address]    [facility] Health Urgent Care Center at [facility] Get Driving Directions [phone] [address] [place], [place] [address]   [facility] Health Urgent Care Center [facility]) Get Driving Directions [phone] [address] [place], [place] [address]  [facility] Health Urgent Care Center [facility]) Get Driving Directions [phone] [address] [place],  [place]  [address]  [facility] Health Urgent Care Center [facility] - at [employer]  [phone] [address] [place],  [place] [address]   [facility] Health Urgent Care at [facility] Get Driving Directions [phone] [address] [place], [place] [address]   [facility] Health Urgent Care at [facility] Get Driving Directions  [phone] [address] [place], [place] [address]   [facility] Health Urgent Care at [facility] Directions [phone] [address], [address] [place], [place] [address]  Your MyChart E-visit questionnaire answers were reviewed by a board certified advanced clinical practitioner to complete your personal care plan based on your specific symptoms.  Thank you for using e-Visits.

## 2023-06-09 DIAGNOSIS — U071 COVID-19: Secondary | ICD-10-CM | POA: Insufficient documentation

## 2023-06-09 NOTE — Assessment & Plan Note (Signed)
Positive COVID home. Will treat with molnupiravir and prednisone tapering. Advised patient to take Tylenol or ibuprofen for fever and pain control. Increase fluid intake and rest.

## 2023-07-26 ENCOUNTER — Other Ambulatory Visit (HOSPITAL_COMMUNITY)
Admission: RE | Admit: 2023-07-26 | Discharge: 2023-07-26 | Disposition: A | Discharge status: Home / Self Care | Payer: BC Managed Care – PPO | Source: Ambulatory Visit | Attending: Internal Medicine | Admitting: Internal Medicine

## 2023-07-26 ENCOUNTER — Ambulatory Visit: Payer: BC Managed Care – PPO | Admitting: Internal Medicine

## 2023-07-26 DIAGNOSIS — F419 Anxiety disorder, unspecified: Secondary | ICD-10-CM

## 2023-07-26 DIAGNOSIS — Z124 Encounter for screening for malignant neoplasm of cervix: Secondary | ICD-10-CM

## 2023-07-26 DIAGNOSIS — F331 Major depressive disorder, recurrent, moderate: Secondary | ICD-10-CM

## 2023-07-26 DIAGNOSIS — Z23 Encounter for immunization: Secondary | ICD-10-CM

## 2023-07-26 DIAGNOSIS — R635 Abnormal weight gain: Secondary | ICD-10-CM

## 2023-07-26 DIAGNOSIS — L299 Pruritus, unspecified: Secondary | ICD-10-CM | POA: Diagnosis not present

## 2023-07-26 NOTE — Progress Notes (Signed)
Subjective:    Patient ID: Kelly Eaton, female    DOB: 1995/11/01, 27 y.o.   MRN: [id number]  Patient here for  Chief Complaint  Patient presents with   Medical Management of Chronic Issues    HPI Here for a scheduled follow up.  Follow up regarding anxiety.  Pap smear today.  Seeing psychiatry.  Currently on effexor and lexapro - now 10mg  q day. Dose reduced.  Feeling better on this regimen.  Is concerned regarding weight gain.  Lexapro decreased.  She plans to start exercising more.  Discussed diet and exercise.  Breathing stable.  Off ocp's.  Tracks her cycle.  Does report itchy scalp.  Has tried head and shoulders.  Persistent issue.    Past Medical History:  Diagnosis Date   Anxiety    Chest pain    Depression    Frequent headaches    cluster headaches.  had MRI.    Hx of migraines    Past Surgical History:  Procedure Laterality Date   CESAREAN SECTION     MOUTH SURGERY  10/02/2011   Family History  Problem Relation Age of Onset   Heart disease Maternal Grandfather    Hypertension Maternal Grandfather    Heart attack Maternal Grandfather    Alcohol abuse Paternal Grandfather    Hypertension Paternal Grandfather    Early death Paternal Grandfather    Heart attack Paternal Grandfather    Social History   Socioeconomic History   Marital status: Married    Spouse name: Not on file   Number of children: Not on file   Years of education: Not on file   Highest education level: Bachelor's degree (e.g., BA, AB, BS)  Occupational History   Not on file  Tobacco Use   Smoking status: Never   Smokeless tobacco: Never  Vaping Use   Vaping status: Never Used  Substance and Sexual Activity   Alcohol use: Not Currently   Drug use: Never   Sexual activity: Yes    Birth control/protection: OCP  Other Topics Concern   Not on file  Social History Narrative   Not on file   Social Determinants of Health   Financial Resource Strain: Low Risk  (03/20/2023)   Overall  Financial Resource Strain (CARDIA)    Difficulty of Paying Living Expenses: Not hard at all  Food Insecurity: No Food Insecurity (03/20/2023)   Hunger Vital Sign    Worried About Running Out of Food in the Last Year: Never true    Ran Out of Food in the Last Year: Never true  Transportation Needs: No Transportation Needs (03/20/2023)   PRAPARE - Administrator, Civil Service (Medical): No    Lack of Transportation (Non-Medical): No  Physical Activity: Sufficiently Active (03/20/2023)   Exercise Vital Sign    Days of Exercise per Week: 5 days    Minutes of Exercise per Session: 30 min  Stress: Stress Concern Present (03/20/2023)   [employer] of Occupational Health - Occupational Stress Questionnaire    Feeling of Stress : Very much  Social Connections: Moderately Isolated (03/20/2023)   Social Connection and Isolation Panel [NHANES]    Frequency of Communication with Friends and Family: More than three times a week    Frequency of Social Gatherings with Friends and Family: More than three times a week    Attends Religious Services: Never    Database administrator or Organizations: No    Attends Banker Meetings:  Not on file    Marital Status: Married     Review of Systems  Constitutional:  Negative for appetite change and unexpected weight change.  HENT:  Negative for congestion and sinus pressure.   Respiratory:  Negative for cough, chest tightness and shortness of breath.   Cardiovascular:  Negative for chest pain and palpitations.  Gastrointestinal:  Negative for abdominal pain, diarrhea, nausea and vomiting.  Genitourinary:  Negative for difficulty urinating and dysuria.  Musculoskeletal:  Negative for joint swelling and myalgias.  Skin:  Negative for color change and rash.       Itchy scalp as outlined.   Neurological:  Negative for dizziness and headaches.  Psychiatric/Behavioral:  Negative for agitation and dysphoric mood.        Objective:      BP 120/72   Pulse 80   Temp 97.9 F (36.6 C)   Resp 16   Ht 4\' 10"  (1.473 m)   Wt 159 lb 9.6 oz (72.4 kg)   SpO2 98%   BMI 33.36 kg/m  Wt Readings from Last 3 Encounters:  07/26/23 159 lb 9.6 oz (72.4 kg)  05/30/23 157 lb (71.2 kg)  04/25/23 157 lb (71.2 kg)    Physical Exam Vitals reviewed.  Constitutional:      General: She is not in acute distress.    Appearance: Normal appearance.  HENT:     Head: Normocephalic and atraumatic.     Right Ear: External ear normal.     Left Ear: External ear normal.  Eyes:     General: No scleral icterus.       Right eye: No discharge.        Left eye: No discharge.     Conjunctiva/sclera: Conjunctivae normal.  Neck:     Thyroid: No thyromegaly.  Cardiovascular:     Rate and Rhythm: Normal rate and regular rhythm.  Pulmonary:     Effort: No respiratory distress.     Breath sounds: Normal breath sounds. No wheezing.  Abdominal:     General: Bowel sounds are normal.     Palpations: Abdomen is soft.     Tenderness: There is no abdominal tenderness.  Genitourinary:    Comments: Normal external genitalia.  Vaginal vault without lesions.  Cervix identified.  Pap smear performed.  Could not appreciate any adnexal masses or tenderness.   Musculoskeletal:        General: No swelling or tenderness.     Cervical back: Neck supple. No tenderness.  Lymphadenopathy:     Cervical: No cervical adenopathy.  Skin:    Findings: No erythema or rash.     Comments: No rash, erythema or scaling - noted on scalp.   Neurological:     Mental Status: She is alert.  Psychiatric:        Mood and Affect: Mood normal.        Behavior: Behavior normal.      Outpatient Encounter Medications as of 07/26/2023  Medication Sig   escitalopram (LEXAPRO) 10 MG tablet Take 10 mg by mouth daily.   EFFEXOR XR 75 MG 24 hr capsule Take 75 mg by mouth.   [DISCONTINUED] escitalopram (LEXAPRO) 20 MG tablet Take 1 tablet (20 mg total) by mouth daily.    [DISCONTINUED] norethindrone (MICRONOR) 0.35 MG tablet Take 1 tablet by mouth daily. (Patient not taking: Reported on 05/30/2023)   [DISCONTINUED] predniSONE (DELTASONE) 10 MG tablet Take 4 tablets ( total 40 mg) by mouth for 2 days; take 3 tablets (  total 30 mg) by mouth for 2 days; take 2 tablets ( total 20 mg) by mouth for 1 day; take 1 tablet ( total 10 mg) by mouth for 1 day.   No facility-administered encounter medications on file as of 07/26/2023.     Lab Results  Component Value Date   WBC 7.5 06/19/2016   HGB 13.1 07/31/2021   HCT 39 07/31/2021   PLT 274 07/31/2021   GLUCOSE 79 06/19/2016   CHOL 146 11/15/2015   TRIG 79.0 11/15/2015   HDL 54.90 11/15/2015   LDLCALC 76 11/15/2015   ALT 13 06/19/2016   AST 16 06/19/2016   NA 139 06/19/2016   K 3.9 06/19/2016   CL 105 06/19/2016   CREATININE 1.01 06/19/2016   BUN 22 06/19/2016   CO2 27 06/19/2016   TSH 0.861 10/31/2021       Assessment & Plan:  Screening for cervical cancer -     Cytology - PAP  Need for influenza vaccination -     Flu vaccine trivalent PF, 6mos and older(Flulaval,Afluria,Fluarix,Fluzone)  Anxiety Assessment & Plan: Currently on lexapro 10mg  and effexor.  This regimen is working well for her.  Continues f/u with psychiatry.    Moderate episode of recurrent major depressive disorder [facility]) Assessment & Plan: Seeing psychiatry.  Doing well on effexor and lexapro.    Weight gain Assessment & Plan: She is concerned regarding weight gain.  On lower dose lexapro now.  Discussed diet and exercise.  Follow.    Itchy scalp Assessment & Plan: Persistent problems with itchy scalp.  Exam as outlined.  Discussed selsun shampoo.  Concerned regarding the selsun shampoo affecting her hair.  Discussed dermatology referral.  Agreeable.   Orders: -     Ambulatory referral to Dermatology     Dale Manly, MD

## 2023-07-27 ENCOUNTER — Encounter: Payer: Self-pay | Admitting: Internal Medicine

## 2023-07-27 DIAGNOSIS — L299 Pruritus, unspecified: Secondary | ICD-10-CM | POA: Insufficient documentation

## 2023-07-27 NOTE — Assessment & Plan Note (Signed)
Seeing psychiatry.  Doing well on effexor and lexapro.

## 2023-07-27 NOTE — Assessment & Plan Note (Signed)
Currently on lexapro 10mg  and effexor.  This regimen is working well for her.  Continues f/u with psychiatry.

## 2023-07-27 NOTE — Assessment & Plan Note (Signed)
Persistent problems with itchy scalp.  Exam as outlined.  Discussed selsun shampoo.  Concerned regarding the selsun shampoo affecting her hair.  Discussed dermatology referral.  Agreeable.

## 2023-07-27 NOTE — Assessment & Plan Note (Signed)
She is concerned regarding weight gain.  On lower dose lexapro now.  Discussed diet and exercise.  Follow.

## 2023-07-30 LAB — CYTOLOGY - PAP
Comment: NEGATIVE
Diagnosis: NEGATIVE
High risk HPV: NEGATIVE

## 2023-08-10 ENCOUNTER — Encounter: Payer: Self-pay | Admitting: Internal Medicine

## 2023-08-12 NOTE — Telephone Encounter (Signed)
If it is continuing to recur despite medication, we will probably need dermatology to evaluate - to try and determine the etiology.

## 2023-09-11 ENCOUNTER — Telehealth: Payer: Self-pay | Admitting: Internal Medicine

## 2023-09-11 NOTE — Telephone Encounter (Signed)
Received a note from Dr Maryruth Bun. She has been having some issues with sleep.  Question raised about possible sleep apnea.  Please schedule her an appt to discuss if agreeable.  Thanks

## 2023-09-11 NOTE — Telephone Encounter (Signed)
Pt returning call

## 2023-09-11 NOTE — Telephone Encounter (Signed)
LMTCB

## 2023-09-16 ENCOUNTER — Ambulatory Visit: Payer: BC Managed Care – PPO | Admitting: Internal Medicine

## 2023-09-16 ENCOUNTER — Encounter: Payer: Self-pay | Admitting: Internal Medicine

## 2023-09-16 VITALS — BP 118/74 | HR 88 | Temp 97.9°F | Resp 16 | Ht <= 58 in | Wt 156.0 lb

## 2023-09-16 DIAGNOSIS — L299 Pruritus, unspecified: Secondary | ICD-10-CM | POA: Diagnosis not present

## 2023-09-16 DIAGNOSIS — F439 Reaction to severe stress, unspecified: Secondary | ICD-10-CM | POA: Diagnosis not present

## 2023-09-16 DIAGNOSIS — R0683 Snoring: Secondary | ICD-10-CM | POA: Diagnosis not present

## 2023-09-16 DIAGNOSIS — R400 Somnolence: Secondary | ICD-10-CM | POA: Diagnosis not present

## 2023-09-16 NOTE — Assessment & Plan Note (Signed)
Persistent problems with itchy scalp.  Exam as outlined.  Discussed selsun shampoo.  Concerned regarding the selsun shampoo affecting her hair.  Discussed dermatology referral.  Referral placed last visit. F/u regarding scheduling.

## 2023-09-16 NOTE — Assessment & Plan Note (Signed)
Increased snoring and daytime somnolence.  Trouble sleeping. Discussed possible sleep apnea. Agreeable for further testing.  Order placed for referral to pulmonary.

## 2023-09-16 NOTE — Progress Notes (Signed)
Subjective:    Patient ID: Kelly Eaton, female    DOB: 1996/08/10, 27 y.o.   MRN: [id number]  Patient here for  Chief Complaint  Patient presents with   Snoring   daytime somnolence    HPI Here as a work in appt.  Work in to discuss possible sleep apnea. Seeing psychiatry. Currently on effexor and lexapro - now 10mg  q day. Has had issues with her sleep and question was raised regarding the possibility of sleep apnea. She has taken trazodone on two separate occasions. Still woke up through the night and the medication made her tired the next morning. Her husband reports increased snoring and has witnessed spells where she appears to not be breathing right. Wakes up frequently and increased fatigue. Stays active through the day. Persistent itchy scalp.    Past Medical History:  Diagnosis Date   Anxiety    Chest pain    Depression    Frequent headaches    cluster headaches.  had MRI.    Hx of migraines    Past Surgical History:  Procedure Laterality Date   CESAREAN SECTION     MOUTH SURGERY  10/02/2011   Family History  Problem Relation Age of Onset   Heart disease Maternal Grandfather    Hypertension Maternal Grandfather    Heart attack Maternal Grandfather    Alcohol abuse Paternal Grandfather    Hypertension Paternal Grandfather    Early death Paternal Grandfather    Heart attack Paternal Grandfather    Social History   Socioeconomic History   Marital status: Married    Spouse name: Not on file   Number of children: Not on file   Years of education: Not on file   Highest education level: Bachelor's degree (e.g., BA, AB, BS)  Occupational History   Not on file  Tobacco Use   Smoking status: Never   Smokeless tobacco: Never  Vaping Use   Vaping status: Never Used  Substance and Sexual Activity   Alcohol use: Not Currently   Drug use: Never   Sexual activity: Yes    Birth control/protection: OCP  Other Topics Concern   Not on file  Social History  Narrative   Not on file   Social Drivers of Health   Financial Resource Strain: Low Risk  (03/20/2023)   Overall Financial Resource Strain (CARDIA)    Difficulty of Paying Living Expenses: Not hard at all  Food Insecurity: No Food Insecurity (03/20/2023)   Hunger Vital Sign    Worried About Running Out of Food in the Last Year: Never true    Ran Out of Food in the Last Year: Never true  Transportation Needs: No Transportation Needs (03/20/2023)   PRAPARE - Administrator, Civil Service (Medical): No    Lack of Transportation (Non-Medical): No  Physical Activity: Sufficiently Active (03/20/2023)   Exercise Vital Sign    Days of Exercise per Week: 5 days    Minutes of Exercise per Session: 30 min  Stress: Stress Concern Present (03/20/2023)   [employer] of Occupational Health - Occupational Stress Questionnaire    Feeling of Stress : Very much  Social Connections: Moderately Isolated (03/20/2023)   Social Connection and Isolation Panel [NHANES]    Frequency of Communication with Friends and Family: More than three times a week    Frequency of Social Gatherings with Friends and Family: More than three times a week    Attends Religious Services: Never    Active  Member of Clubs or Organizations: No    Attends Engineer, structural: Not on file    Marital Status: Married     Review of Systems  Constitutional:  Positive for fatigue. Negative for appetite change and unexpected weight change.  HENT:  Negative for congestion and sinus pressure.   Respiratory:  Negative for cough, chest tightness and shortness of breath.   Cardiovascular:  Negative for chest pain, palpitations and leg swelling.  Gastrointestinal:  Negative for abdominal pain, diarrhea, nausea and vomiting.  Genitourinary:  Negative for difficulty urinating and dysuria.  Musculoskeletal:  Negative for joint swelling and myalgias.  Skin:  Negative for wound.       Itchy scalp.   Neurological:   Negative for dizziness and headaches.  Psychiatric/Behavioral:  Negative for agitation and dysphoric mood.        Objective:     BP 118/74   Pulse 88   Temp 97.9 F (36.6 C)   Resp 16   Ht 4\' 10"  (1.473 m)   Wt 156 lb (70.8 kg)   SpO2 98%   BMI 32.60 kg/m  Wt Readings from Last 3 Encounters:  09/16/23 156 lb (70.8 kg)  07/26/23 159 lb 9.6 oz (72.4 kg)  05/30/23 157 lb (71.2 kg)    Physical Exam Vitals reviewed.  Constitutional:      General: She is not in acute distress.    Appearance: Normal appearance.  HENT:     Head: Normocephalic and atraumatic.     Right Ear: External ear normal.     Left Ear: External ear normal.     Mouth/Throat:     Pharynx: No oropharyngeal exudate or posterior oropharyngeal erythema.  Eyes:     General: No scleral icterus.       Right eye: No discharge.        Left eye: No discharge.     Conjunctiva/sclera: Conjunctivae normal.  Neck:     Thyroid: No thyromegaly.  Cardiovascular:     Rate and Rhythm: Normal rate and regular rhythm.  Pulmonary:     Effort: No respiratory distress.     Breath sounds: Normal breath sounds. No wheezing.  Abdominal:     General: Bowel sounds are normal.     Palpations: Abdomen is soft.     Tenderness: There is no abdominal tenderness.  Musculoskeletal:        General: No swelling or tenderness.     Cervical back: Neck supple. No tenderness.  Lymphadenopathy:     Cervical: No cervical adenopathy.  Skin:    Findings: No erythema or rash.  Neurological:     Mental Status: She is alert.  Psychiatric:        Mood and Affect: Mood normal.        Behavior: Behavior normal.      Outpatient Encounter Medications as of 09/16/2023  Medication Sig   traZODone (DESYREL) 50 MG tablet Take 50-100 mg by mouth at bedtime as needed.   EFFEXOR XR 75 MG 24 hr capsule Take 75 mg by mouth.   escitalopram (LEXAPRO) 10 MG tablet Take 10 mg by mouth daily.   No facility-administered encounter medications on file  as of 09/16/2023.     Lab Results  Component Value Date   WBC 7.5 06/19/2016   HGB 13.1 07/31/2021   HCT 39 07/31/2021   PLT 274 07/31/2021   GLUCOSE 79 06/19/2016   CHOL 146 11/15/2015   TRIG 79.0 11/15/2015   HDL 54.90 11/15/2015  LDLCALC 76 11/15/2015   ALT 13 06/19/2016   AST 16 06/19/2016   NA 139 06/19/2016   K 3.9 06/19/2016   CL 105 06/19/2016   CREATININE 1.01 06/19/2016   BUN 22 06/19/2016   CO2 27 06/19/2016   TSH 0.861 10/31/2021    No results found.     Assessment & Plan:  Snoring Assessment & Plan: Increased snoring and daytime somnolence.  Trouble sleeping. Discussed possible sleep apnea. Agreeable for further testing.  Order placed for referral to pulmonary.   Orders: -     Ambulatory referral to Pulmonology  Daytime somnolence -     Ambulatory referral to Pulmonology  Stress Assessment & Plan: Appears to be doing better.  On lexapro.  Stable.  Follow.  Continue lexapro/effexor. Seeing a therapist.    Itchy scalp Assessment & Plan: Persistent problems with itchy scalp.  Exam as outlined.  Discussed selsun shampoo.  Concerned regarding the selsun shampoo affecting her hair.  Discussed dermatology referral.  Referral placed last visit. F/u regarding scheduling.       Dale [place], MD

## 2023-09-16 NOTE — Assessment & Plan Note (Signed)
Appears to be doing better.  On lexapro.  Stable.  Follow.  Continue lexapro/effexor. Seeing a therapist.

## 2023-10-18 ENCOUNTER — Ambulatory Visit (INDEPENDENT_AMBULATORY_CARE_PROVIDER_SITE_OTHER): Payer: 59

## 2023-10-18 VITALS — BP 108/64 | HR 76 | Ht 59.0 in | Wt 161.0 lb

## 2023-10-18 DIAGNOSIS — Z3201 Encounter for pregnancy test, result positive: Secondary | ICD-10-CM

## 2023-10-18 DIAGNOSIS — N912 Amenorrhea, unspecified: Secondary | ICD-10-CM

## 2023-10-18 LAB — POCT URINE PREGNANCY: Preg Test, Ur: POSITIVE — AB

## 2023-10-18 NOTE — Patient Instructions (Signed)
First Trimester of Pregnancy  The first trimester of pregnancy starts on the first day of your last monthly period until the end of week 13. This is months 1 through 3 of pregnancy. A week after a sperm fertilizes an egg, the egg will implant into the wall of the uterus and begin to develop into a baby. Body changes during your first trimester Your body goes through many changes during pregnancy. The changes usually return to normal after your baby is born. Physical changes Your breasts may grow larger and may hurt. The area around your nipples may get darker. Your periods will stop. Your hair and nails may grow faster. You may pee more often. Health changes You may tire easily. Your gums may bleed and may be sensitive when you brush and floss. You may not feel hungry. You may have heartburn. You may throw up or feel like you may throw up. You may want to eat some foods, but not others. You may have headaches. You may have trouble pooping (constipation). Other changes Your emotions may change from day to day. You may have more dreams. Follow these instructions at home: Medicines Talk to your health care provider if you're taking medicines. Ask if the medicines are safe to take during pregnancy. Your provider may change the medicines that you take. Do not take any medicines unless told to by your provider. Take a prenatal vitamin that has at least 600 micrograms (mcg) of folic acid. Do not use herbal medicines, illegal substances, or medicines that are not approved by your provider. Eating and drinking While you're pregnant your body needs extra food for your growing baby. Talk with your provider about what to eat while pregnant. Activity Most women are able to exercise during pregnancy. Exercises may need to change as your pregnancy goes on. Talk to your provider about your activities and exercise routines. Relieving pain and discomfort Wear a good, supportive bra if your breasts  hurt. Rest with your legs raised if you have leg cramps or low back pain. Safety Wear your seatbelt at all times when you're in a car. Talk to your provider if someone hits you, hurts you, or yells at you. Talk with your provider if you're feeling sad or have thoughts of hurting yourself. Lifestyle Certain things can be harmful while you're pregnant. Follow these rules: Do not use hot tubs, steam rooms, or saunas. Do not douche. Do not use tampons or scented pads. Do not drink alcohol,smoke, vape, or use products with nicotine or tobacco in them. If you need help quitting, talk with your provider. Avoid cat litter boxes and soil used by cats. These things carry germs that can cause harm to your pregnancy and your baby. General instructions Keep all follow-up visits. It helps you and your unborn baby stay as healthy as possible. Write down your questions. Take them to your visits. Your provider will: Talk with you about your overall health. Give you advice or refer you to specialists who can help with different needs, including: Prenatal education classes. Mental health and counseling. Foods and healthy eating. Ask for help if you need help with food. Call your dentist and ask to be seen. Brush your teeth with a soft toothbrush. Floss gently. Where to find more information American Pregnancy Association: americanpregnancy.org [employer] of Obstetricians and Gynecologists: acog.org Office on [employer] Health: TravelLesson.ca Contact a health care provider if: You feel dizzy, faint, or have a fever. You vomit or have watery poop (diarrhea) for 2  days or more. You have abnormal discharge or bleeding from your vagina. You have pain when you pee or your pee smells bad. You have cramps, pain, or pressure in your belly area. Get help right away if: You have trouble breathing or chest pain. You have any kind of injury, such as from a fall or a car crash. These symptoms may be an  emergency. Get help right away. Call 911. Do not wait to see if the symptoms will go away. Do not drive yourself to the hospital. This information is not intended to replace advice given to you by your health care provider. Make sure you discuss any questions you have with your health care provider. Document Revised: 06/20/2023 Document Reviewed: 01/18/2023 Elsevier Patient Education  2024 Elsevier Inc.  Commonly Asked Questions During Pregnancy  Cats: A parasite can be excreted in cat feces.  To avoid exposure you need to have another person empty the little box.  If you must empty the litter box you will need to wear gloves.  Wash your hands after handling your cat.  This parasite can also be found in raw or undercooked meat so this should also be avoided.  Colds, Sore Throats, Flu: Please check your medication sheet to see what you can take for symptoms.  If your symptoms are unrelieved by these medications please call the office.  Dental Work: Most any dental work Agricultural consultant recommends is permitted.  X-rays should only be taken during the first trimester if absolutely necessary.  Your abdomen should be shielded with a lead apron during all x-rays.  Please notify your provider prior to receiving any x-rays.  Novocaine is fine; gas is not recommended.  If your dentist requires a note from [place] prior to dental work please call the office and we will provide one for you.  Exercise: Exercise is an important part of staying healthy during your pregnancy.  You may continue most exercises you were accustomed to prior to pregnancy.  Later in your pregnancy you will most likely notice you have difficulty with activities requiring balance like riding a bicycle.  It is important that you listen to your body and avoid activities that put you at a higher risk of falling.  Adequate rest and staying well hydrated are a must!  If you have questions about the safety of specific activities ask your provider.     Exposure to Children with illness: Try to avoid obvious exposure; report any symptoms to [place] when noted,  If you have chicken pos, red measles or mumps, you should be immune to these diseases.   Please do not take any vaccines while pregnant unless you have checked with your OB provider.  Fetal Movement: After 28 weeks we recommend you do "kick counts" twice daily.  Lie or sit down in a calm quiet environment and count your baby movements "kicks".  You should feel your baby at least 10 times per hour.  If you have not felt 10 kicks within the first hour get up, walk around and have something sweet to eat or drink then repeat for an additional hour.  If count remains less than 10 per hour notify your provider.  Fumigating: Follow your pest control agent's advice as to how long to stay out of your home.  Ventilate the area well before re-entering.  Hemorrhoids:   Most over-the-counter preparations can be used during pregnancy.  Check your medication to see what is safe to use.  It is important to  use a stool softener or fiber in your diet and to drink lots of liquids.  If hemorrhoids seem to be getting worse please call the office.   Hot Tubs:  Hot tubs Jacuzzis and saunas are not recommended while pregnant.  These increase your internal body temperature and should be avoided.  Intercourse:  Sexual intercourse is safe during pregnancy as long as you are comfortable, unless otherwise advised by your provider.  Spotting may occur after intercourse; report any bright red bleeding that is heavier than spotting.  Labor:  If you know that you are in labor, please go to the hospital.  If you are unsure, please call the office and let us help you decide what to do.  Lifting, straining, etc:  If your job requires heavy lifting or straining please check with your provider for any limitations.  Generally, you should not lift items heavier than that you can lift simply with your hands and arms (no back  muscles)  Painting:  Paint fumes do not harm your pregnancy, but may make you ill and should be avoided if possible.  Latex or water based paints have less odor than oils.  Use adequate ventilation while painting.  Permanents & Hair Color:  Chemicals in hair dyes are not recommended as they cause increase hair dryness which can increase hair loss during pregnancy.  " Highlighting" and permanents are allowed.  Dye may be absorbed differently and permanents may not hold as well during pregnancy.  Sunbathing:  Use a sunscreen, as skin burns easily during pregnancy.  Drink plenty of fluids; avoid over heating.  Tanning Beds:  Because their possible side effects are still unknown, tanning beds are not recommended.  Ultrasound Scans:  Routine ultrasounds are performed at approximately 20 weeks.  You will be able to see your baby's general anatomy an if you would like to know the gender this can usually be determined as well.  If it is questionable when you conceived you may also receive an ultrasound early in your pregnancy for dating purposes.  Otherwise ultrasound exams are not routinely performed unless there is a medical necessity.  Although you can request a scan we ask that you pay for it when conducted because insurance does not cover " patient request" scans.  Work: If your pregnancy proceeds without complications you may work until your due date, unless your physician or employer advises otherwise.  Round Ligament Pain/Pelvic Discomfort:  Sharp, shooting pains not associated with bleeding are fairly common, usually occurring in the second trimester of pregnancy.  They tend to be worse when standing up or when you remain standing for long periods of time.  These are the result of pressure of certain pelvic ligaments called "round ligaments".  Rest, Tylenol and heat seem to be the most effective relief.  As the womb and fetus grow, they rise out of the pelvis and the discomfort improves.  Please  notify the office if your pain seems different than that described.  It may represent a more serious condition.  Common Medications Safe in Pregnancy  Acne:      Constipation:  Benzoyl Peroxide     Colace  Clindamycin      Dulcolax Suppository  Topica Erythromycin     Fibercon  Salicylic Acid      Metamucil         Miralax AVOID:        Senakot   Accutane    Cough:  Retin-A  Cough Drops  Tetracycline      Phenergan w/ Codeine if Rx  Minocycline      Robitussin (Plain & DM)  Antibiotics:     Crabs/Lice:  Ceclor       RID  Cephalosporins    AVOID:  E-Mycins      Kwell  Keflex  Macrobid/Macrodantin   Diarrhea:  Penicillin      Kao-Pectate  Zithromax      Imodium AD         PUSH FLUIDS AVOID:       Cipro     Fever:  Tetracycline      Tylenol (Regular or Extra  Minocycline       Strength)  Levaquin      Extra Strength-Do not          Exceed 8 tabs/24 hrs Caffeine:        200mg /day (equiv. To 1 cup of coffee or  approx. 3 12 oz sodas)         Gas: Cold/Hayfever:       Gas-X  Benadryl      Mylicon  Claritin       Phazyme  **Claritin-D        Chlor-Trimeton    Headaches:  Dimetapp      ASA-Free Excedrin  Drixoral-Non-Drowsy     Cold Compress  Mucinex (Guaifenasin)     Tylenol (Regular or Extra  Sudafed/Sudafed-12 Hour     Strength)  **Sudafed PE Pseudoephedrine   Tylenol Cold & Sinus     Vicks Vapor Rub  Zyrtec  **AVOID if Problems With Blood Pressure         Heartburn: Avoid lying down for at least 1 hour after meals  Aciphex      Maalox     Rash:  Milk of Magnesia     Benadryl    Mylanta       1% Hydrocortisone Cream  Pepcid  Pepcid Complete   Sleep Aids:  Prevacid      Ambien   Prilosec       Benadryl  Rolaids       Chamomile Tea  Tums (Limit 4/day)     Unisom         Tylenol PM         Warm milk-add vanilla or  Hemorrhoids:       Sugar for taste  Anusol/Anusol H.C.  (RX: Analapram 2.5%)  Sugar Substitutes:  Hydrocortisone OTC     Ok in  moderation  Preparation H      Tucks        Vaseline lotion applied to tissue with wiping    Herpes:     Throat:  Acyclovir      Oragel  Famvir  Valtrex     Vaccines:         Flu Shot Leg Cramps:       *Gardasil  Benadryl      Hepatitis A         Hepatitis B Nasal Spray:       Pneumovax  Saline Nasal Spray     Polio Booster         Tetanus Nausea:       Tuberculosis test or PPD  Vitamin B6 25 mg TID   AVOID:    Dramamine      *Gardasil  Emetrol       Live Poliovirus  Ginger Root 250 mg QID    MMR (measles, mumps &  High Complex Carbs @ Bedtime    rebella)  Sea Bands-Accupressure    Varicella (Chickenpox)  Unisom 1/2 tab TID     *No known complications           If received before Pain:         Known pregnancy;   Darvocet       Resume series after  Lortab        Delivery  Percocet    Yeast:   Tramadol      Femstat  Tylenol 3      Gyne-lotrimin  Ultram       Monistat  Vicodin           MISC:         All Sunscreens           Hair Coloring/highlights          Insect Repellant's          (Including DEET)         Mystic Tans

## 2023-10-18 NOTE — Progress Notes (Signed)
    NURSE VISIT NOTE  Subjective:    Patient ID: Kelly Eaton, female    DOB: 01/26/1996, 28 y.o.   MRN: [id number]  HPI  Patient is a 28 y.o. G14P1001 female who presents for evaluation of amenorrhea. She believes she could be pregnant. Pregnancy is desired. Sexual Activity: has sex with males. Current symptoms also include: nausea and positive home pregnancy test. Last period was normal.    Objective:    BP 108/64   Pulse 76   Ht 4\' 11"  (1.499 m)   Wt 161 lb (73 kg)   LMP 08/28/2023 (Exact Date)   Breastfeeding No   BMI 32.52 kg/m   Lab Review  Results for orders placed or performed in visit on 10/18/23  POCT urine pregnancy  Result Value Ref Range   Preg Test, Ur Positive (A) Negative    Assessment:   1. Amenorrhea     Plan:   Pregnancy Test: Positive  Estimated Date of Delivery: 06/03/24 BP Cuff Measurement taken. Cuff Size Adult Encouraged well-balanced diet, plenty of rest when needed, pre-natal vitamins daily and walking for exercise.  Discussed self-help for nausea, avoiding OTC medications until consulting provider or pharmacist, other than Tylenol as needed, minimal caffeine (1-2 cups daily) and avoiding alcohol.   She will schedule her nurse visit @ 7-[redacted] wks pregnant, u/s for dating @10  wk, and NOB visit at [redacted] wk pregnant.    Feel free to call with any questions.     Donnetta Hail, CMA

## 2023-10-21 ENCOUNTER — Telehealth (INDEPENDENT_AMBULATORY_CARE_PROVIDER_SITE_OTHER): Payer: 59

## 2023-10-21 DIAGNOSIS — Z3481 Encounter for supervision of other normal pregnancy, first trimester: Secondary | ICD-10-CM

## 2023-10-21 DIAGNOSIS — Z3689 Encounter for other specified antenatal screening: Secondary | ICD-10-CM | POA: Diagnosis not present

## 2023-10-21 DIAGNOSIS — Z3490 Encounter for supervision of normal pregnancy, unspecified, unspecified trimester: Secondary | ICD-10-CM | POA: Insufficient documentation

## 2023-10-21 DIAGNOSIS — Z3A01 Less than 8 weeks gestation of pregnancy: Secondary | ICD-10-CM

## 2023-10-21 NOTE — Patient Instructions (Signed)
First Trimester of Pregnancy  The first trimester of pregnancy starts on the first day of your last monthly period until the end of week 13. This is months 1 through 3 of pregnancy. A week after a sperm fertilizes an egg, the egg will implant into the wall of the uterus and begin to develop into a baby. Body changes during your first trimester Your body goes through many changes during pregnancy. The changes usually return to normal after your baby is born. Physical changes Your breasts may grow larger and may hurt. The area around your nipples may get darker. Your periods will stop. Your hair and nails may grow faster. You may pee more often. Health changes You may tire easily. Your gums may bleed and may be sensitive when you brush and floss. You may not feel hungry. You may have heartburn. You may throw up or feel like you may throw up. You may want to eat some foods, but not others. You may have headaches. You may have trouble pooping (constipation). Other changes Your emotions may change from day to day. You may have more dreams. Follow these instructions at home: Medicines Talk to your health care provider if you're taking medicines. Ask if the medicines are safe to take during pregnancy. Your provider may change the medicines that you take. Do not take any medicines unless told to by your provider. Take a prenatal vitamin that has at least 600 micrograms (mcg) of folic acid. Do not use herbal medicines, illegal substances, or medicines that are not approved by your provider. Eating and drinking While you're pregnant your body needs extra food for your growing baby. Talk with your provider about what to eat while pregnant. Activity Most women are able to exercise during pregnancy. Exercises may need to change as your pregnancy goes on. Talk to your provider about your activities and exercise routines. Relieving pain and discomfort Wear a good, supportive bra if your breasts  hurt. Rest with your legs raised if you have leg cramps or low back pain. Safety Wear your seatbelt at all times when you're in a car. Talk to your provider if someone hits you, hurts you, or yells at you. Talk with your provider if you're feeling sad or have thoughts of hurting yourself. Lifestyle Certain things can be harmful while you're pregnant. Follow these rules: Do not use hot tubs, steam rooms, or saunas. Do not douche. Do not use tampons or scented pads. Do not drink alcohol,smoke, vape, or use products with nicotine or tobacco in them. If you need help quitting, talk with your provider. Avoid cat litter boxes and soil used by cats. These things carry germs that can cause harm to your pregnancy and your baby. General instructions Keep all follow-up visits. It helps you and your unborn baby stay as healthy as possible. Write down your questions. Take them to your visits. Your provider will: Talk with you about your overall health. Give you advice or refer you to specialists who can help with different needs, including: Prenatal education classes. Mental health and counseling. Foods and healthy eating. Ask for help if you need help with food. Call your dentist and ask to be seen. Brush your teeth with a soft toothbrush. Floss gently. Where to find more information American Pregnancy Association: americanpregnancy.org [employer]: acog.org Office on [employer] Health: [employer] Contact a health care provider if: You feel dizzy, faint, or have a fever. You vomit or have watery poop (diarrhea) for 2  days or more. You have abnormal discharge or bleeding from your vagina. You have pain when you pee or your pee smells bad. You have cramps, pain, or pressure in your belly area. Get help right away if: You have trouble breathing or chest pain. You have any kind of injury, such as from a fall or a car crash. These symptoms may be an  emergency. Get help right away. Call 911. Do not wait to see if the symptoms will go away. Do not drive yourself to the hospital. This information is not intended to replace advice given to you by your health care provider. Make sure you discuss any questions you have with your health care provider. Document Revised: 06/20/2023 Document Reviewed: 01/18/2023 Elsevier Patient Education  2024 Elsevier Inc.  Morning Sickness Morning sickness is when you throw up or feel like you may throw up during pregnancy. This condition often occurs in the morning, but it can also occur at any time of day. Morning sickness is most common during the first three months of pregnancy, but it can go on throughout the pregnancy. Morning sickness is usually harmless. But if you throw up all the time, you should see your health care provider. You may also hear this condition called nausea and vomiting of pregnancy. What are the causes? The cause of morning sickness is not known. It may be linked to changes in hormones during pregnancy. What increases the risk? You're more likely to have morning sickness if: You had morning sickness in another pregnancy. You're pregnant with more than one baby, such as twins. You had morning sickness in other pregnancies. You have had motion sickness before you were pregnant. You have had bad headaches or migraines before you were pregnant. What are the signs or symptoms? Symptoms of morning sickness include: Feeling like you may throw up. Throwing up. How is this diagnosed? Morning sickness is diagnosed based on your symptoms. How is this treated? Treatment is usually not needed for morning sickness. You may only need to change what you eat. In some cases, your provider may give you: Vitamin B6 supplements. Medicines to prevent throwing up. Ginger. Follow these instructions at home: Medicines Take your medicines only as told by your provider. Do not use any prescription,  over-the-counter, or herbal medicines for morning sickness without first talking with your provider. Take prenatal vitamins. These can stop or lessen the symptoms of morning sickness. If you feel like you may throw up after taking prenatal vitamins, take them at night or with a snack. Eating and drinking     Eat dry toast or crackers before getting out of bed. Eat 5 or 6 small meals a day. Try ginger ale made with real ginger, ginger tea, or ginger candies. Drink fluids throughout the day. Eat protein foods when you need a snack. Nuts, yogurt, and cheese are good choices. Eat dry and bland foods like rice or baked potatoes. Foods that are high in carbohydrates are often helpful. Have someone cook for you if the smell of food makes you want to throw up. Foods to avoid Greasy foods. Fatty foods. Spicy foods. General instructions Try to avoid smells that make you feel sick. Use an air purifier to keep the air in your house free of smells. Try using an acupressure wristband. This is a wristband that's used to treat motion sickness. Try acupuncture. In this treatment, a provider puts thin needles into certain areas of your body to make you feel better. Brush your teeth  after throwing up or rinse with a mix of baking soda and water. The acid in throw-up can hurt your teeth. Contact a health care provider if: Your symptoms do not get better. You feel dizzy or light-headed. You're losing weight. Get help right away if: The feeling that you may throw up will not go away, or you can't stop throwing up. You faint. You have very bad pain in your belly. This information is not intended to replace advice given to you by your health care provider. Make sure you discuss any questions you have with your health care provider. Document Revised: 06/20/2023 Document Reviewed: 12/27/2022 Elsevier Patient Education  2024 Elsevier Inc.  Commonly Asked Questions During Pregnancy  Cats: A parasite can  be excreted in cat feces.  To avoid exposure you need to have another person empty the little box.  If you must empty the litter box you will need to wear gloves.  Wash your hands after handling your cat.  This parasite can also be found in raw or undercooked meat so this should also be avoided.  Colds, Sore Throats, Flu: Please check your medication sheet to see what you can take for symptoms.  If your symptoms are unrelieved by these medications please call the office.  Dental Work: Most any dental work Agricultural consultant recommends is permitted.  X-rays should only be taken during the first trimester if absolutely necessary.  Your abdomen should be shielded with a lead apron during all x-rays.  Please notify your provider prior to receiving any x-rays.  Novocaine is fine; gas is not recommended.  If your dentist requires a note from [place] prior to dental work please call the office and we will provide one for you.  Exercise: Exercise is an important part of staying healthy during your pregnancy.  You may continue most exercises you were accustomed to prior to pregnancy.  Later in your pregnancy you will most likely notice you have difficulty with activities requiring balance like riding a bicycle.  It is important that you listen to your body and avoid activities that put you at a higher risk of falling.  Adequate rest and staying well hydrated are a must!  If you have questions about the safety of specific activities ask your provider.    Exposure to Children with illness: Try to avoid obvious exposure; report any symptoms to [place] when noted,  If you have chicken pos, red measles or mumps, you should be immune to these diseases.   Please do not take any vaccines while pregnant unless you have checked with your OB provider.  Fetal Movement: After 28 weeks we recommend you do "kick counts" twice daily.  Lie or sit down in a calm quiet environment and count your baby movements "kicks".  You should feel your baby at  least 10 times per hour.  If you have not felt 10 kicks within the first hour get up, walk around and have something sweet to eat or drink then repeat for an additional hour.  If count remains less than 10 per hour notify your provider.  Fumigating: Follow your pest control agent's advice as to how long to stay out of your home.  Ventilate the area well before re-entering.  Hemorrhoids:   Most over-the-counter preparations can be used during pregnancy.  Check your medication to see what is safe to use.  It is important to use a stool softener or fiber in your diet and to drink lots of liquids.  If  hemorrhoids seem to be getting worse please call the office.   Hot Tubs:  Hot tubs Jacuzzis and saunas are not recommended while pregnant.  These increase your internal body temperature and should be avoided.  Intercourse:  Sexual intercourse is safe during pregnancy as long as you are comfortable, unless otherwise advised by your provider.  Spotting may occur after intercourse; report any bright red bleeding that is heavier than spotting.  Labor:  If you know that you are in labor, please go to the hospital.  If you are unsure, please call the office and let us help you decide what to do.  Lifting, straining, etc:  If your job requires heavy lifting or straining please check with your provider for any limitations.  Generally, you should not lift items heavier than that you can lift simply with your hands and arms (no back muscles)  Painting:  Paint fumes do not harm your pregnancy, but may make you ill and should be avoided if possible.  Latex or water based paints have less odor than oils.  Use adequate ventilation while painting.  Permanents & Hair Color:  Chemicals in hair dyes are not recommended as they cause increase hair dryness which can increase hair loss during pregnancy.  " Highlighting" and permanents are allowed.  Dye may be absorbed differently and permanents may not hold as well during  pregnancy.  Sunbathing:  Use a sunscreen, as skin burns easily during pregnancy.  Drink plenty of fluids; avoid over heating.  Tanning Beds:  Because their possible side effects are still unknown, tanning beds are not recommended.  Ultrasound Scans:  Routine ultrasounds are performed at approximately 20 weeks.  You will be able to see your baby's general anatomy an if you would like to know the gender this can usually be determined as well.  If it is questionable when you conceived you may also receive an ultrasound early in your pregnancy for dating purposes.  Otherwise ultrasound exams are not routinely performed unless there is a medical necessity.  Although you can request a scan we ask that you pay for it when conducted because insurance does not cover " patient request" scans.  Work: If your pregnancy proceeds without complications you may work until your due date, unless your physician or employer advises otherwise.  Round Ligament Pain/Pelvic Discomfort:  Sharp, shooting pains not associated with bleeding are fairly common, usually occurring in the second trimester of pregnancy.  They tend to be worse when standing up or when you remain standing for long periods of time.  These are the result of pressure of certain pelvic ligaments called "round ligaments".  Rest, Tylenol and heat seem to be the most effective relief.  As the womb and fetus grow, they rise out of the pelvis and the discomfort improves.  Please notify the office if your pain seems different than that described.  It may represent a more serious condition.   Common Medications Safe in Pregnancy  Acne:      Constipation:  Benzoyl Peroxide     Colace  Clindamycin      Dulcolax Suppository  Topica Erythromycin     Fibercon  Salicylic Acid      Metamucil         Miralax AVOID:        Senakot   Accutane    Cough:  Retin-A       Cough Drops  Tetracycline      Phenergan w/ Codeine if Rx  Minocycline  Robitussin (Plain &  DM)  Antibiotics:     Crabs/Lice:  Ceclor       RID  Cephalosporins    AVOID:  E-Mycins      Kwell  Keflex  Macrobid/Macrodantin   Diarrhea:  Penicillin      Kao-Pectate  Zithromax      Imodium AD         PUSH FLUIDS AVOID:       Cipro     Fever:  Tetracycline      Tylenol (Regular or Extra  Minocycline       Strength)  Levaquin      Extra Strength-Do not          Exceed 8 tabs/24 hrs Caffeine:        200mg /day (equiv. To 1 cup of coffee or  approx. 3 12 oz sodas)         Gas: Cold/Hayfever:       Gas-X  Benadryl      Mylicon  Claritin       Phazyme  **Claritin-D        Chlor-Trimeton    Headaches:  Dimetapp      ASA-Free Excedrin  Drixoral-Non-Drowsy     Cold Compress  Mucinex (Guaifenasin)     Tylenol (Regular or Extra  Sudafed/Sudafed-12 Hour     Strength)  **Sudafed PE Pseudoephedrine   Tylenol Cold & Sinus     Vicks Vapor Rub  Zyrtec  **AVOID if Problems With Blood Pressure         Heartburn: Avoid lying down for at least 1 hour after meals  Aciphex      Maalox     Rash:  Milk of Magnesia     Benadryl    Mylanta       1% Hydrocortisone Cream  Pepcid  Pepcid Complete   Sleep Aids:  Prevacid      Ambien   Prilosec       Benadryl  Rolaids       Chamomile Tea  Tums (Limit 4/day)     Unisom         Tylenol PM         Warm milk-add vanilla or  Hemorrhoids:       Sugar for taste  Anusol/Anusol H.C.  (RX: Analapram 2.5%)  Sugar Substitutes:  Hydrocortisone OTC     Ok in moderation  Preparation H      Tucks        Vaseline lotion applied to tissue with wiping    Herpes:     Throat:  Acyclovir      Oragel  Famvir  Valtrex     Vaccines:         Flu Shot Leg Cramps:       *Gardasil  Benadryl      Hepatitis A         Hepatitis B Nasal Spray:       Pneumovax  Saline Nasal Spray     Polio Booster         Tetanus Nausea:       Tuberculosis test or PPD  Vitamin B6 25 mg TID   AVOID:    Dramamine      *Gardasil  Emetrol       Live Poliovirus  Ginger  Root 250 mg QID    MMR (measles, mumps &  High Complex Carbs @ Bedtime    rebella)  Sea Bands-Accupressure    Varicella (Chickenpox)  Unisom 1/2  tab TID     *No known complications           If received before Pain:         Known pregnancy;   Darvocet       Resume series after  Lortab        Delivery  Percocet    Yeast:   Tramadol      Femstat  Tylenol 3      Gyne-lotrimin  Ultram       Monistat  Vicodin           MISC:         All Sunscreens           Hair Coloring/highlights          Insect Repellant's          (Including DEET)         Mystic Tans

## 2023-10-21 NOTE — Progress Notes (Signed)
New OB Intake  I connected with  Kelly Eaton on 10/21/23 at  3:15 PM EST by MyChart Video Visit and verified that I am speaking with the correct person using two identifiers. Nurse is located at [facility] and pt is located at home.  I discussed the limitations, risks, security and privacy concerns of performing an evaluation and management service by telephone and the availability of in person appointments. I also discussed with the patient that there may be a patient responsible charge related to this service. The patient expressed understanding and agreed to proceed.  I explained I am completing New OB Intake today. We discussed her EDD of 06/03/2024 that is based on LMP of 08/28/2023. Pt is G2P1001. I reviewed her allergies, medications, Medical/Surgical/OB history, and appropriate screenings. There are no cats in the home. Based on history, this is a/an pregnancy complicated by cord around baby neck at delivery had to have a c-section  . Her obstetrical history is significant for obesity.  Patient Active Problem List   Diagnosis Date Noted   Snoring 09/16/2023   Itchy scalp 07/27/2023   Anxiety 03/21/2023   Low back pain 12/20/2022   Ear fullness 12/20/2022   Encounter for completion of form with patient 07/21/2022   Moderate episode of recurrent major depressive disorder (HCC) 11/19/2017   Weight gain 10/07/2016   Health care maintenance 11/21/2015   Depression 11/15/2015   Stress 11/15/2015   Hearing loss 11/15/2015    Concerns addressed today  Delivery Plans:  Plans to deliver at [facility]  Anatomy US Explained first scheduled [place] will be around 20 weeks. Labs Discussed Avelina Laine genetic screening with patient. Patient desires genetic testing to be drawn at new OB visit. Discussed possible labs to be drawn at new OB appointment.  COVID Vaccine Patient has had COVID vaccine.  Patient has had Flu Vaccine in October. Patient desires TDAP and RSV  vaccines.  Social Determinants of Health Food Insecurity: denies food insecurity WIC Referral: Patient is interested in referral to [facility].  Transportation: Patient denies transportation needs. Childcare: Discussed no children allowed at ultrasound appointments.   First visit review I reviewed new OB appt with pt. I explained she will have ob bloodwork and he pap smear is utd, pelvic exam if indicated. Explained pt will be seen by Dr. Lonny Prude at first visit; encounter routed to appropriate provider.       Santiago Bumpers, CMA [facility] 10/21/2023  3:14 PM

## 2023-10-24 ENCOUNTER — Encounter: Payer: Self-pay | Admitting: Obstetrics and Gynecology

## 2023-11-04 ENCOUNTER — Ambulatory Visit: Payer: 59 | Admitting: Internal Medicine

## 2023-11-04 ENCOUNTER — Encounter: Payer: Self-pay | Admitting: Internal Medicine

## 2023-11-04 VITALS — BP 92/60 | HR 78 | Temp 97.6°F | Ht 59.0 in | Wt 160.2 lb

## 2023-11-04 DIAGNOSIS — G4710 Hypersomnia, unspecified: Secondary | ICD-10-CM | POA: Diagnosis not present

## 2023-11-04 DIAGNOSIS — G4733 Obstructive sleep apnea (adult) (pediatric): Secondary | ICD-10-CM

## 2023-11-04 NOTE — Patient Instructions (Signed)
Recommend home sleep study to assess for sleep apnea  Avoid Allergens and Irritants Avoid secondhand smoke Avoid SICK contacts Recommend  Masking  when appropriate Recommend Keep up-to-date with vaccinations   Be aware of reduced alertness and do not drive or operate heavy machinery if experiencing this or drowsiness.  Exercise encouraged, as tolerated. Encouraged proper weight management.  Important to get eight or more hours of sleep  Limiting the use of the computer and television before bedtime.  Decrease naps during the day, so night time sleep will become enhanced.  Limit caffeine, and sleep deprivation.

## 2023-11-04 NOTE — Progress Notes (Unsigned)
Name: Kelly Eaton MRN: [id number] DOB: [birth year]    CHIEF COMPLAINT:  EXCESSIVE DAYTIME SLEEPINESS Assessment for sleep apnea   HISTORY OF PRESENT ILLNESS: Patient is seen today for problems and issues with sleep related to excessive daytime sleepiness Patient  has been having sleep problems for many years Patient has been having excessive daytime sleepiness for a long time Patient has been having extreme fatigue and tiredness, lack of energy +  very Loud snoring every night + struggling breathe at night and gasps for air + morning headaches + Nonrefreshing sleep  Patient is currently pregnant with her second child at [redacted] weeks gestational age Patient states she has had symptoms prior to her pregnancy for about 3 to 4 years   Discussed sleep data and reviewed with patient.  Encouraged proper weight management.  Discussed driving precautions and its relationship with hypersomnolence. 28  Discussed operating dangerous equipment and its relationship with hypersomnolence.  Discussed sleep hygiene, and benefits of a fixed sleep waked time.  The importance of getting eight or more hours of sleep discussed with patient.  Discussed limiting the use of the computer and television before bedtime.  Decrease naps during the day, so night time sleep will become enhanced.  Limit caffeine, and sleep deprivation.  HTN, stroke, and heart failure are potential risk factors.    EPWORTH SLEEP SCORE 8  No exacerbation at this time No evidence of heart failure at this time No evidence or signs of infection at this time No respiratory distress No fevers, chills, nausea, vomiting, diarrhea No evidence of lower extremity edema No evidence hemoptysis  Non-smoker nonalcoholic Teaches third grade  Reviewed literature on sleep apnea in pregnancy I have explained to patient that she will need to be tested as if she was not pregnant This will not affect her pregnancy In fact have explained to  her that underlying sleep apnea in pregnancy could be detrimental without therapy Patient is willing to proceed with sleep apnea testing   PAST MEDICAL HISTORY :   has a past medical history of Anxiety, Chest pain, Depression, Frequent headaches, and migraines.  has a past surgical history that includes Mouth surgery (10/02/2011) and Cesarean section. Prior to Admission medications   Medication Sig Start Date End Date Taking? Authorizing Provider  Prenatal Vit-Fe Fumarate-FA (PRENATAL PO) Take by mouth daily.    [provider]   Allergies  Allergen Reactions   Oxycodone Other (See Comments)    FAMILY HISTORY:  family history includes Alcohol abuse in her paternal grandfather; Early death in her paternal grandfather; Heart attack in her maternal grandfather and paternal grandfather; Heart disease in her maternal grandfather; Hypertension in her maternal grandfather and paternal grandfather. SOCIAL HISTORY:  reports that she has never smoked. She has never been exposed to tobacco smoke. She has never used smokeless tobacco. She reports that she does not currently use alcohol. She reports that she does not use drugs.   Review of Systems:  Gen:  Denies  fever, sweats, chills weight loss  HEENT: Denies blurred vision, double vision, ear pain, eye pain, hearing loss, nose bleeds, sore throat Cardiac:  No dizziness, chest pain or heaviness, chest tightness,edema, No JVD Resp:   No cough, -sputum production, -shortness of breath,-wheezing, -hemoptysis,  Gi: Denies swallowing difficulty, stomach pain, nausea or vomiting, diarrhea, constipation, bowel incontinence Gu:  Denies bladder incontinence, burning urine Ext:   Denies Joint pain, stiffness or swelling Skin: Denies  skin rash, easy bruising or bleeding or hives Endoc:  Denies polyuria, polydipsia , polyphagia or weight change Psych:   Denies depression, insomnia or hallucinations  Other:  All other systems negative   ALL  OTHER ROS ARE NEGATIVE   BP 92/60 (BP Location: Left Arm, Patient Position: Sitting, Cuff Size: Normal)   Pulse 78   Temp 97.6 F (36.4 C) (Temporal)   Ht 4\' 11"  (1.499 m)   Wt 160 lb 3.2 oz (72.7 kg)   LMP 08/28/2023 (Exact Date)   SpO2 98%   BMI 32.36 kg/m    Physical Examination:   General Appearance: No distress  EYES PERRLA, EOM intact.   NECK Supple, No JVD ORAL CAVITY MALLAMPATI 3/4 Pulmonary: normal breath sounds, No wheezing.  CardiovascularNormal S1,S2.  No m/r/g.   Abdomen: Benign, Soft, non-tender. Skin:   warm, no rashes, no ecchymosis  Extremities: normal, no cyanosis, clubbing. Neuro:without focal findings,  speech normal  PSYCHIATRIC: Mood, affect within normal limits.   ALL OTHER ROS ARE NEGATIVE    ASSESSMENT AND PLAN SYNOPSIS 28 year old pleasant white female [redacted] weeks gestational age pregnancy Patient with signs and symptoms of excessive daytime sleepiness with probable underlying diagnosis of obstructive sleep apnea    Recommend Sleep Study for definitve diagnosis Home sleep study to assess for sleep apnea Be aware of reduced alertness and do not drive or operate heavy machinery if experiencing this or drowsiness.  Exercise encouraged, as tolerated. Encouraged proper weight management.  Important to get eight or more hours of sleep  Limiting the use of the computer and television before bedtime.  Decrease naps during the day, so night time sleep will become enhanced.  Limit caffeine, and sleep deprivation.     MEDICATION ADJUSTMENTS/LABS AND TESTS ORDERED: Recommend home sleep study Avoid Allergens and Irritants Avoid secondhand smoke Avoid SICK contacts Recommend  Masking  when appropriate Recommend Keep up-to-date with vaccinations     CURRENT MEDICATIONS REVIEWED AT LENGTH WITH PATIENT TODAY   Patient  satisfied with Plan of action and management. All questions answered  Follow up  3 months   I spent a total of  63 minutes  reviewing chart data, face-to-face evaluation with the patient, counseling and coordination of care as detailed above.    Lucie Leather, M.D.  [facility]  Medical Director [facility] Medical Director [facility] Cardio-Pulmonary Department

## 2023-11-11 ENCOUNTER — Telehealth: Payer: 59 | Admitting: Physician Assistant

## 2023-11-11 DIAGNOSIS — G4733 Obstructive sleep apnea (adult) (pediatric): Secondary | ICD-10-CM

## 2023-11-11 DIAGNOSIS — S0003XA Contusion of scalp, initial encounter: Secondary | ICD-10-CM | POA: Diagnosis not present

## 2023-11-11 NOTE — Patient Instructions (Signed)
  Asher Blade, thank you for joining Char Common Ward, PA-C for today's virtual visit.  While this provider is not your primary care provider (PCP), if your PCP is located in our provider database this encounter information will be shared with them immediately following your visit.   A [facility] MyChart account gives you access to today's visit and all your visits, tests, and labs performed at [facility] " click here if you don't have a [facility] MyChart account or go to [email or web address]  Consent: (Patient) Kelly Eaton provided verbal consent for this virtual visit at the beginning of the encounter.  Current Medications:  Current Outpatient Medications:    Prenatal Vit-Fe Fumarate-FA (PRENATAL PO), Take by mouth daily., Disp: , Rfl:    Medications ordered in this encounter:  No orders of the defined types were placed in this encounter.    *If you need refills on other medications prior to your next appointment, please contact your pharmacy*  Follow-Up: Call back or seek an in-person evaluation if the symptoms worsen or if the condition fails to improve as anticipated.  [facility] [phone]  Other Instructions  If you develop severe headache, nausea and vomiting, trouble with your vision, confusion go to the Emergency Department for evaluation.  Keep follow up with OB tomorrow.  Can apply ice to head and take Tylenol as needed.    If you have been instructed to have an in-person evaluation today at a local Urgent Care facility, please use the link below. It will take you to a list of all of our available [facility], including address, phone number and hours of operation. Please do not delay care.  [facility]  If you or a family member do not have a primary care provider, use the link below to schedule a visit and establish care. When you choose a [facility] primary care physician or advanced practice  provider, you gain a long-term partner in health. Find a Primary Care Provider  Learn more about [facility] in-office and virtual care options: [facility] - Get Care Now

## 2023-11-11 NOTE — Progress Notes (Signed)
 Virtual Visit Consent   Kelly Eaton, you are scheduled for a virtual visit with a [facility] provider today. Just as with appointments in the office, your consent must be obtained to participate. Your consent will be active for this visit and any virtual visit you may have with one of our providers in the next 365 days. If you have a MyChart account, a copy of this consent can be sent to you electronically.  As this is a virtual visit, video technology does not allow for your provider to perform a traditional examination. This may limit your provider's ability to fully assess your condition. If your provider identifies any concerns that need to be evaluated in person or the need to arrange testing (such as labs, EKG, etc.), we will make arrangements to do so. Although advances in technology are sophisticated, we cannot ensure that it will always work on either your end or our end. If the connection with a video visit is poor, the visit may have to be switched to a telephone visit. With either a video or telephone visit, we are not always able to ensure that we have a secure connection.  By engaging in this virtual visit, you consent to the provision of healthcare and authorize for your insurance to be billed (if applicable) for the services provided during this visit. Depending on your insurance coverage, you may receive a charge related to this service.  I need to obtain your verbal consent now. Are you willing to proceed with your visit today? Kelly Eaton has provided verbal consent on 11/11/2023 for a virtual visit (video or telephone). Char Common Ward, PA-C  Date: 11/11/2023 7:24 PM   Virtual Visit via Video Note   I, Char Common Ward, connected with  Kelly Eaton  ([id number], [birth year]) on 11/11/23 at  7:15 PM EST by a video-enabled telemedicine application and verified that I am speaking with the correct person using two identifiers.  Location: Patient: Virtual Visit Location Patient:  Home Provider: Virtual Visit Location Provider: Home Office   I discussed the limitations of evaluation and management by telemedicine and the availability of in person appointments. The patient expressed understanding and agreed to proceed.    History of Present Illness: Kelly Eaton is a 28 y.o. who identifies as a female who was assigned female at birth, and is being seen today after hitting her head on the corner of a cabinet earlier today.  Reports a bump on her scalp.  Reports headache since then.  Denies dizziness, LOC, nausea, vision changes.  Reports she has nausea, but this is baseline with her pregnancy, she is currently [redacted] weeks pregnant.  HPI: HPI  Problems:  Patient Active Problem List   Diagnosis Date Noted   Supervision of normal pregnancy 10/21/2023   Snoring 09/16/2023   Itchy scalp 07/27/2023   Anxiety 03/21/2023   Low back pain 12/20/2022   Ear fullness 12/20/2022   Encounter for completion of form with patient 07/21/2022   Moderate episode of recurrent major depressive disorder (HCC) 11/19/2017   Weight gain 10/07/2016   Health care maintenance 11/21/2015   Depression 11/15/2015   Stress 11/15/2015   Hearing loss 11/15/2015    Allergies:  Allergies  Allergen Reactions   Oxycodone Other (See Comments)   Medications:  Current Outpatient Medications:    Prenatal Vit-Fe Fumarate-FA (PRENATAL PO), Take by mouth daily., Disp: , Rfl:   Observations/Objective: Patient is well-developed, well-nourished in no acute distress.  Resting comfortably at  home.  Head is normocephalic, atraumatic.  No labored breathing.  Speech is clear and coherent with logical content.  Patient is alert and oriented at baseline.    Assessment and Plan: 1. Contusion of scalp, initial encounter (Primary)    At this time pt without LOC, dizziness, visual changes, vomiting, confusion.  Neuro exam limited due to telehealth visit.  Pt overall well appearing, in no acute distress,  speaking in complete sentences.  ED precautions given.  She has an OB appointment tomorrow.   Follow Up Instructions: I discussed the assessment and treatment plan with the patient. The patient was provided an opportunity to ask questions and all were answered. The patient agreed with the plan and demonstrated an understanding of the instructions.  A copy of instructions were sent to the patient via MyChart unless otherwise noted below.     The patient was advised to call back or seek an in-person evaluation if the symptoms worsen or if the condition fails to improve as anticipated.    Char Common Ward, PA-C

## 2023-11-12 ENCOUNTER — Other Ambulatory Visit: Payer: Self-pay | Admitting: Obstetrics

## 2023-11-12 ENCOUNTER — Ambulatory Visit: Payer: 59

## 2023-11-12 DIAGNOSIS — Z3A11 11 weeks gestation of pregnancy: Secondary | ICD-10-CM

## 2023-11-12 DIAGNOSIS — O99211 Obesity complicating pregnancy, first trimester: Secondary | ICD-10-CM

## 2023-11-12 DIAGNOSIS — O3680X0 Pregnancy with inconclusive fetal viability, not applicable or unspecified: Secondary | ICD-10-CM

## 2023-11-12 DIAGNOSIS — Z3687 Encounter for antenatal screening for uncertain dates: Secondary | ICD-10-CM | POA: Diagnosis not present

## 2023-11-22 ENCOUNTER — Ambulatory Visit (INDEPENDENT_AMBULATORY_CARE_PROVIDER_SITE_OTHER): Payer: 59 | Admitting: Obstetrics

## 2023-11-22 ENCOUNTER — Other Ambulatory Visit (HOSPITAL_COMMUNITY)
Admission: RE | Admit: 2023-11-22 | Discharge: 2023-11-22 | Disposition: A | Discharge status: Home / Self Care | Payer: 59 | Source: Ambulatory Visit | Attending: Obstetrics | Admitting: Obstetrics

## 2023-11-22 VITALS — BP 122/62 | HR 80 | Ht 59.0 in | Wt 160.0 lb

## 2023-11-22 DIAGNOSIS — Z1379 Encounter for other screening for genetic and chromosomal anomalies: Secondary | ICD-10-CM

## 2023-11-22 DIAGNOSIS — Z131 Encounter for screening for diabetes mellitus: Secondary | ICD-10-CM

## 2023-11-22 DIAGNOSIS — Z3491 Encounter for supervision of normal pregnancy, unspecified, first trimester: Secondary | ICD-10-CM | POA: Diagnosis not present

## 2023-11-22 DIAGNOSIS — Z113 Encounter for screening for infections with a predominantly sexual mode of transmission: Secondary | ICD-10-CM

## 2023-11-22 DIAGNOSIS — Z3A12 12 weeks gestation of pregnancy: Secondary | ICD-10-CM | POA: Diagnosis not present

## 2023-11-22 NOTE — Progress Notes (Signed)
 OBSTETRIC INITIAL PRENATAL VISIT  Subjective:    Kelly Eaton is being seen today for her first obstetrical visit.  This is a planned pregnancy. She is a 28 y.o. G2P1001 female at [redacted]w[redacted]d gestation, Estimated Date of Delivery: 06/03/24 with Patient's last menstrual period was 08/28/2023 (exact date).  This is consistent with [redacted]w[redacted]d sono.   Her obstetrical history is significant for  prior cesarean x 1 and obesity . She does desire TOLAC this delivery. Her PMH is significant for hx of depression and anxiety, was stable on Lexapro, but psychiatrist recommended to wean and she is currently on no medication, but considering Wellbutrin.   Relationship with FOB: spouse, living together. Patient does intend to breast feed. Pregnancy history fully reviewed.  OB History  Gravida Para Term Preterm AB Living  2 1 1  0 0 1  SAB IAB Ectopic Multiple Live Births  0 0 0 0 0    # Outcome Date GA Lbr Len/2nd Weight Sex Type Anes PTL Lv  2 Current           1 Term 03/18/22 [redacted]w[redacted]d  6 lb (2.722 kg) M CS-Unspec EPI       Name: Ayesha Rumpf    Gynecologic History:  Last pap smear was 07/26/23.  Results were Normal.  Denies h/o abnormal pap smears in the past.  Denies history of STIs.  Contraception prior to conception: none   Past Medical History:  Diagnosis Date   Anxiety    Chest pain    Depression    Frequent headaches    cluster headaches.  had MRI.    Hx of migraines     Family History  Problem Relation Age of Onset   Heart disease Maternal Grandfather    Hypertension Maternal Grandfather    Heart attack Maternal Grandfather    Alcohol abuse Paternal Grandfather    Hypertension Paternal Grandfather    Early death Paternal Grandfather    Heart attack Paternal Grandfather     Past Surgical History:  Procedure Laterality Date   CESAREAN SECTION     MOUTH SURGERY  10/02/2011    Social History   Socioeconomic History   Marital status: Married    Spouse name: Alex   Number of children:  1   Years of education: Not on file   Highest education level: Bachelor's degree (e.g., BA, AB, BS)  Occupational History   Not on file  Tobacco Use   Smoking status: Never    Passive exposure: Never   Smokeless tobacco: Never  Vaping Use   Vaping status: Never Used  Substance and Sexual Activity   Alcohol use: Not Currently   Drug use: Never   Sexual activity: Yes    Birth control/protection: None  Other Topics Concern   Not on file  Social History Narrative   Not on file   Social Drivers of Health   Financial Resource Strain: Low Risk  (03/20/2023)   Overall Financial Resource Strain (CARDIA)    Difficulty of Paying Living Expenses: Not hard at all  Food Insecurity: No Food Insecurity (10/21/2023)   Hunger Vital Sign    Worried About Running Out of Food in the Last Year: Never true    Ran Out of Food in the Last Year: Never true  Transportation Needs: No Transportation Needs (10/21/2023)   PRAPARE - Administrator, Civil Service (Medical): No    Lack of Transportation (Non-Medical): No  Physical Activity: Sufficiently Active (03/20/2023)   Exercise Vital  Sign    Days of Exercise per Week: 5 days    Minutes of Exercise per Session: 30 min  Stress: Stress Concern Present (10/21/2023)   [employer] of Occupational Health - Occupational Stress Questionnaire    Feeling of Stress : Rather much  Social Connections: Moderately Isolated (03/20/2023)   Social Connection and Isolation Panel [NHANES]    Frequency of Communication with Friends and Family: More than three times a week    Frequency of Social Gatherings with Friends and Family: More than three times a week    Attends Religious Services: Never    Database administrator or Organizations: No    Attends Engineer, structural: Not on file    Marital Status: Married  Catering manager Violence: Not At Risk (10/21/2023)   Humiliation, Afraid, Rape, and Kick questionnaire    Fear of Current or  Ex-Partner: No    Emotionally Abused: No    Physically Abused: No    Sexually Abused: No    Current Outpatient Medications on File Prior to Visit  Medication Sig Dispense Refill   Prenatal Vit-Fe Fumarate-FA (PRENATAL PO) Take by mouth daily.     No current facility-administered medications on file prior to visit.    Allergies  Allergen Reactions   Oxycodone Other (See Comments)   Review of Systems General: Not Present- Fever, Weight Loss and Weight Gain. Skin: Not Present- Rash. HEENT: Not Present- Blurred Vision, Headache and Bleeding Gums. Respiratory: Not Present- Difficulty Breathing. Breast: Not Present- Breast Mass. Cardiovascular: Not Present- Chest Pain, Elevated Blood Pressure, Fainting / Blacking Out and Shortness of Breath. Gastrointestinal: Not Present- Abdominal Pain, Constipation, Nausea and Vomiting. Female Genitourinary: Not Present- Frequency, Painful Urination, Pelvic Pain, Vaginal Bleeding, Vaginal Discharge, Contractions, regular, Fetal Movements Decreased, Urinary Complaints and Vaginal Fluid. Musculoskeletal: Not Present- Back Pain and Leg Cramps. Neurological: Not Present- Dizziness. Psychiatric: Not Present- Depression.     Objective:  Blood pressure 122/62, pulse 80, weight 160 lb (72.6 kg), last menstrual period 08/28/2023, not currently breastfeeding.  Body mass index is 32.32 kg/m.  General Appearance:    Alert, cooperative, no distress, appears stated age  Head:    Normocephalic, without obvious abnormality, atraumatic  Eyes:    PERRL, conjunctiva/corneas clear, EOM's intact, both eyes  Ears:    Normal external ear canals, both ears  Nose:   Nares normal, septum midline, mucosa normal, no drainage or sinus tenderness  Throat:   Lips, mucosa, and tongue normal; teeth and gums normal  Neck:   Supple, symmetrical, trachea midline, no adenopathy; thyroid: no enlargement/tenderness/nodules; no carotid bruit or JVD  Back:     Symmetric, no curvature,  ROM normal, no CVA tenderness  Lungs:     Clear to auscultation bilaterally, respirations unlabored  Chest Wall:    No tenderness or deformity   Heart:    Regular rate and rhythm, S1 and S2 normal, no murmur, rub or gallop  Breast Exam:    No tenderness, masses, or nipple abnormality  Abdomen:     Soft, non-tender, bowel sounds active all four quadrants, no masses, no organomegaly.  FHT 158  bpm.  Genitalia:    Pelvic:external genitalia normal, vagina without lesions, discharge, or tenderness, rectovaginal septum  normal. Cervix normal in appearance, no cervical motion tenderness, no adnexal masses or tenderness.  Pregnancy positive findings: uterine enlargement: 12 wk size, nontender.   Rectal:    Normal external sphincter.  No hemorrhoids appreciated. Internal exam not done.   Extremities:  Extremities normal, atraumatic, no cyanosis or edema  Pulses:   2+ and symmetric all extremities  Skin:   Skin color, texture, turgor normal, no rashes or lesions  Lymph nodes:   Cervical, supraclavicular, and axillary nodes normal  Neurologic:   CNII-XII intact, normal strength, sensation and reflexes throughout     Assessment:   1. Initial obstetric visit in first trimester   2. Routine screening for STI (sexually transmitted infection)   3. Genetic screening   4. Diabetes mellitus screening     Plan:   Supervision of normal pregnancy  - Initial labs ordered; pap UTD - Prenatal vitamins encouraged. - Problem list reviewed and updated. - New OB counseling:  The patient has been given an overview regarding routine prenatal care.  Recommendations regarding diet, weight gain, and exercise in pregnancy were given. - Prenatal testing, optional genetic testing, and ultrasound use in pregnancy were reviewed.  Traditional genetic screening vs cell-fee DNA genetic screening discussed, including risks and benefits. Testing ordered. Pt will be picking up envelope with gender inside, warned not to check  MyChart and to inform office personnel if calling.  - Anatomy US at 20wks, to be done at hospital per pt preference -- pt will check with her insurance - Benefits of Breast Feeding were discussed. The patient is encouraged to consider nursing her baby post partum.  2. BMI and pt's mother w/GDM -Early 1h GTT  3. Prior cesarean x 1 - Desires TOLAC, discussed consent later in pregnancy  4. Hx of depression and anxiety:  -Encouraged pt to discuss with psychiatrist getting back on SSRI/SNRI, previously stable on Lexapro; reviewed safety data for Lexapro in pregnancy and we prioritize maternal mental health and well-being.    Follow up in 4 weeks.   Julieanne Manson, DO [facility]

## 2023-11-23 LAB — URINALYSIS, ROUTINE W REFLEX MICROSCOPIC
Bilirubin, UA: NEGATIVE
Glucose, UA: NEGATIVE
Ketones, UA: NEGATIVE
Leukocytes,UA: NEGATIVE
Nitrite, UA: NEGATIVE
Protein,UA: NEGATIVE
RBC, UA: NEGATIVE
Specific Gravity, UA: 1.029 (ref 1.005–1.030)
Urobilinogen, Ur: 0.2 mg/dL (ref 0.2–1.0)
pH, UA: 5.5 (ref 5.0–7.5)

## 2023-11-24 LAB — CBC/D/PLT+RPR+RH+ABO+RUBIGG...
Antibody Screen: NEGATIVE
Basophils Absolute: 0 10*3/uL (ref 0.0–0.2)
Basos: 0 %
EOS (ABSOLUTE): 0.1 10*3/uL (ref 0.0–0.4)
Eos: 1 %
HCV Ab: NONREACTIVE
HIV Screen 4th Generation wRfx: NONREACTIVE
Hematocrit: 39.5 % (ref 34.0–46.6)
Hemoglobin: 13.6 g/dL (ref 11.1–15.9)
Hepatitis B Surface Ag: NEGATIVE
Immature Grans (Abs): 0 10*3/uL (ref 0.0–0.1)
Immature Granulocytes: 0 %
Lymphocytes Absolute: 2.2 10*3/uL (ref 0.7–3.1)
Lymphs: 22 %
MCH: 32.5 pg (ref 26.6–33.0)
MCHC: 34.4 g/dL (ref 31.5–35.7)
MCV: 95 fL (ref 79–97)
Monocytes Absolute: 0.5 10*3/uL (ref 0.1–0.9)
Monocytes: 5 %
Neutrophils Absolute: 7.2 10*3/uL — ABNORMAL HIGH (ref 1.4–7.0)
Neutrophils: 72 %
Platelets: 255 10*3/uL (ref 150–450)
RBC: 4.18 x10E6/uL (ref 3.77–5.28)
RDW: 12.8 % (ref 11.7–15.4)
RPR Ser Ql: NONREACTIVE
Rh Factor: POSITIVE
Rubella Antibodies, IGG: 6.07 {index} (ref >0.99)
Varicella zoster IgG: REACTIVE
WBC: 10 10*3/uL (ref 3.4–10.8)

## 2023-11-24 LAB — CULTURE, OB URINE

## 2023-11-24 LAB — HCV INTERPRETATION

## 2023-11-24 LAB — URINE CULTURE, OB REFLEX

## 2023-11-26 LAB — CERVICOVAGINAL ANCILLARY ONLY
Bacterial Vaginitis (gardnerella): NEGATIVE
Chlamydia: NEGATIVE
Comment: NEGATIVE
Comment: NEGATIVE
Comment: NEGATIVE
Comment: NORMAL
Neisseria Gonorrhea: NEGATIVE
Trichomonas: NEGATIVE

## 2023-11-26 LAB — MONITOR DRUG PROFILE 14(MW)
Amphetamine Scrn, Ur: NEGATIVE ng/mL
BARBITURATE SCREEN URINE: NEGATIVE ng/mL
BENZODIAZEPINE SCREEN, URINE: NEGATIVE ng/mL
Buprenorphine, Urine: NEGATIVE ng/mL
CANNABINOIDS UR QL SCN: NEGATIVE ng/mL
Cocaine (Metab) Scrn, Ur: NEGATIVE ng/mL
Creatinine(Crt), U: 146.8 mg/dL (ref 20.0–300.0)
Fentanyl, Urine: NEGATIVE pg/mL
Meperidine Screen, Urine: NEGATIVE ng/mL
Methadone Screen, Urine: NEGATIVE ng/mL
OXYCODONE+OXYMORPHONE UR QL SCN: NEGATIVE ng/mL
Opiate Scrn, Ur: NEGATIVE ng/mL
Ph of Urine: 5.6 (ref 4.5–8.9)
Phencyclidine Qn, Ur: NEGATIVE ng/mL
Propoxyphene Scrn, Ur: NEGATIVE ng/mL
SPECIFIC GRAVITY: 1.028
Tramadol Screen, Urine: NEGATIVE ng/mL

## 2023-11-26 LAB — NICOTINE SCREEN, URINE: Cotinine Ql Scrn, Ur: NEGATIVE ng/mL

## 2023-11-27 ENCOUNTER — Telehealth: Payer: Self-pay | Admitting: Pulmonary Disease

## 2023-11-27 DIAGNOSIS — Z0389 Encounter for observation for other suspected diseases and conditions ruled out: Secondary | ICD-10-CM | POA: Diagnosis not present

## 2023-11-27 LAB — MATERNIT 21 PLUS CORE, BLOOD
Fetal Fraction: 9
Result (T21): NEGATIVE
Trisomy 13 (Patau syndrome): NEGATIVE
Trisomy 18 (Edwards syndrome): NEGATIVE
Trisomy 21 (Down syndrome): NEGATIVE

## 2023-11-27 NOTE — Telephone Encounter (Signed)
 Call patient  Sleep study result  Date of study: 11/11/2023  Impression:  Negative study for significant sleep disordered breathing with an AHI of 0.9, O2 nadir of 90% Current study is consistent with absence of moderate to severe sleep disordered breathing  Recommendation:  If there remains significant clinical concern for sleep disordered breathing, an in lab study may be considered  Evaluate for other causes of excessive daytime sleepiness and fatigue  Clinical follow-up of symptoms  Encourage weight loss measures

## 2023-11-28 NOTE — Telephone Encounter (Signed)
 Lmtcb

## 2023-12-03 NOTE — Telephone Encounter (Signed)
 Patient calling back for results

## 2023-12-03 NOTE — Telephone Encounter (Signed)
 Patient advised of sleep study. She will hold off on in lab sleep study.  Nothing further needed.  Erin Fulling, MD  You; Bonney Leitz, CMA6 days ago    NL sleep study, no evidence of sleep apnea Can order in lab sleep study for further work up

## 2023-12-07 ENCOUNTER — Encounter: Payer: Self-pay | Admitting: Internal Medicine

## 2023-12-09 NOTE — Telephone Encounter (Signed)
 Left message to call the office back if she would like an appointment.

## 2023-12-19 ENCOUNTER — Ambulatory Visit (INDEPENDENT_AMBULATORY_CARE_PROVIDER_SITE_OTHER): Payer: 59

## 2023-12-19 ENCOUNTER — Other Ambulatory Visit: Payer: 59

## 2023-12-19 VITALS — BP 110/70 | HR 74 | Wt 162.6 lb

## 2023-12-19 DIAGNOSIS — Z98891 History of uterine scar from previous surgery: Secondary | ICD-10-CM | POA: Insufficient documentation

## 2023-12-19 DIAGNOSIS — F419 Anxiety disorder, unspecified: Secondary | ICD-10-CM

## 2023-12-19 DIAGNOSIS — Z3A16 16 weeks gestation of pregnancy: Secondary | ICD-10-CM

## 2023-12-19 DIAGNOSIS — Z3482 Encounter for supervision of other normal pregnancy, second trimester: Secondary | ICD-10-CM | POA: Diagnosis not present

## 2023-12-19 DIAGNOSIS — Z131 Encounter for screening for diabetes mellitus: Secondary | ICD-10-CM

## 2023-12-19 DIAGNOSIS — Z1379 Encounter for other screening for genetic and chromosomal anomalies: Secondary | ICD-10-CM

## 2023-12-19 NOTE — Progress Notes (Signed)
    Return Prenatal Note   Assessment/Plan   Plan  28 y.o. G2P1001 at [redacted]w[redacted]d presents for follow-up OB visit. Reviewed prenatal record including previous visit note.  Supervision of normal pregnancy - Early 1 hour glucola completed today. - Anatomy US ordered to be completed at the hospital per patient preference.  - Reviewed red flag warning signs anticipatory guidance for upcoming prenatal care.   Anxiety - Started back on 5 mg of Lexapro for about two weeks.    Orders Placed This Encounter  Procedures   US OB Comp + 14 Wk    Standing Status:   Future    Expected Date:   01/19/2024    Expiration Date:   03/20/2024    Reason for Exam (SYMPTOM  OR DIAGNOSIS REQUIRED):   Fetal Anatomic Survey    Preferred Imaging Location?:   [facility]   AFP, Serum, Open Spina Bifida    Is patient insulin dependent?:   No    Weight (lbs):   163    Gestational Age (GA), weeks:   16.1    Date on which patient was at this GA:   12/19/2023    GA Calculation Method:   LMP    GA Date:   06/03/2024    Number of fetuses:   1    Reason for screen:   OTHER             screen   Return in about 4 weeks (around 01/16/2024) for ROB.   Future Appointments  Date Time Provider Department Center  01/16/2024  9:35 AM Slaughterbeck, Irving Burton, CNM [facility] None  01/24/2024  3:30 PM Dale Winchester, MD [facility]    For next visit:  continue with routine prenatal care     Subjective   27 y.o. G2P1001 at [redacted]w[redacted]d presents for this follow-up prenatal visit.  Patient doing well, no complaints today. Patient reports: Movement: Present Contractions: Not present  Objective   Flow sheet Vitals: Pulse Rate: 74 BP: 110/70 Fundal Height: 16 cm Fetal Heart Rate (bpm): 145 Total weight gain: 6 lb 9.6 oz (2.994 kg)  General Appearance  No acute distress, well appearing, and well nourished Pulmonary   Normal work of breathing Neurologic   Alert and oriented to person, place, and  time Psychiatric   Mood and affect within normal limits  Lindalou Hose Akeema Broder, CNM  03/20/253:19 PM

## 2023-12-19 NOTE — Assessment & Plan Note (Signed)
-   Started back on 5 mg of Lexapro for about two weeks.

## 2023-12-19 NOTE — Assessment & Plan Note (Signed)
-   Early 1 hour glucola completed today. - Anatomy US ordered to be completed at the hospital per patient preference.  - Reviewed red flag warning signs anticipatory guidance for upcoming prenatal care.

## 2023-12-21 LAB — AFP, SERUM, OPEN SPINA BIFIDA
AFP MoM: 1.77
AFP Value: 56.5 ng/mL
Gest. Age on Collection Date: 16.1 wk
Maternal Age At EDD: 29.4 a
OSBR Risk 1 IN: 1373
Test Results:: NEGATIVE
Weight: 162 [lb_av]

## 2023-12-21 LAB — GLUCOSE TOLERANCE, 1 HOUR: Glucose, 1Hr PP: 171 mg/dL (ref 70–199)

## 2023-12-23 ENCOUNTER — Encounter: Payer: Self-pay | Admitting: Obstetrics

## 2023-12-23 ENCOUNTER — Other Ambulatory Visit: Payer: Self-pay | Admitting: Obstetrics

## 2023-12-23 DIAGNOSIS — O99810 Abnormal glucose complicating pregnancy: Secondary | ICD-10-CM

## 2023-12-23 NOTE — Progress Notes (Signed)
 3hGTT order

## 2024-01-15 ENCOUNTER — Ambulatory Visit
Admission: RE | Admit: 2024-01-15 | Discharge: 2024-01-15 | Disposition: A | Discharge status: Home / Self Care | Source: Ambulatory Visit

## 2024-01-15 DIAGNOSIS — Z363 Encounter for antenatal screening for malformations: Secondary | ICD-10-CM | POA: Insufficient documentation

## 2024-01-15 DIAGNOSIS — Z3689 Encounter for other specified antenatal screening: Secondary | ICD-10-CM | POA: Diagnosis present

## 2024-01-15 DIAGNOSIS — Z3A19 19 weeks gestation of pregnancy: Secondary | ICD-10-CM | POA: Diagnosis not present

## 2024-01-15 DIAGNOSIS — Z3482 Encounter for supervision of other normal pregnancy, second trimester: Secondary | ICD-10-CM | POA: Insufficient documentation

## 2024-01-16 ENCOUNTER — Ambulatory Visit (INDEPENDENT_AMBULATORY_CARE_PROVIDER_SITE_OTHER): Admitting: Certified Nurse Midwife

## 2024-01-16 ENCOUNTER — Other Ambulatory Visit

## 2024-01-16 ENCOUNTER — Encounter: Payer: Self-pay | Admitting: Certified Nurse Midwife

## 2024-01-16 VITALS — BP 101/67 | HR 89 | Wt 162.3 lb

## 2024-01-16 DIAGNOSIS — Z3482 Encounter for supervision of other normal pregnancy, second trimester: Secondary | ICD-10-CM

## 2024-01-16 DIAGNOSIS — O99810 Abnormal glucose complicating pregnancy: Secondary | ICD-10-CM

## 2024-01-16 NOTE — Progress Notes (Signed)
    Return Prenatal Note   Assessment/Plan   Plan  28 y.o. G2P1001 at [redacted]w[redacted]d presents for follow-up OB visit. Reviewed prenatal record including previous visit note.  Supervision of normal pregnancy Completing 3 hour GTT for elevated early one hour. Feeling well overall. Endorses some heart palpatations after she eats. Worse with greasy food. Advise taking Tums when it happens to see if it improves symptoms. If so, advise to take pepcid daily instead of PRN. Reviewed red flag warning signs anticipatory guidance for upcoming prenatal care.     No orders of the defined types were placed in this encounter.  No follow-ups on file.   Future Appointments  Date Time Provider Department Center  01/24/2024  3:30 PM Dellar Fenton, MD [facility]  02/13/2024  3:55 PM Free, Verita Glassman, CNM [facility] None    For next visit:  continue with routine prenatal care     Subjective   28 y.o. G2P1001 at [redacted]w[redacted]d presents for this follow-up prenatal visit.  Patient reports heart palpations  Patient reports: Movement: Present Contractions: Not present  Objective   Flow sheet Vitals: Pulse Rate: 89 BP: 101/67 Fundal Height: 20 cm Fetal Heart Rate (bpm): 150 Total weight gain: 6 lb 4.8 oz (2.858 kg)  General Appearance  No acute distress, well appearing, and well nourished Pulmonary   Normal work of breathing Neurologic   Alert and oriented to person, place, and time Psychiatric   Mood and affect within normal limits  Donato Fu, CNM  01/15/2509:55 AM

## 2024-01-16 NOTE — Assessment & Plan Note (Signed)
 Completing 3 hour GTT for elevated early one hour. Feeling well overall. Endorses some heart palpatations after she eats. Worse with greasy food. Advise taking Tums when it happens to see if it improves symptoms. If so, advise to take pepcid daily instead of PRN. Reviewed red flag warning signs anticipatory guidance for upcoming prenatal care.

## 2024-01-17 LAB — GESTATIONAL GLUCOSE TOLERANCE
Glucose, Fasting: 71 mg/dL (ref 70–94)
Glucose, GTT - 1 Hour: 172 mg/dL (ref 70–179)
Glucose, GTT - 2 Hour: 144 mg/dL (ref 70–154)
Glucose, GTT - 3 Hour: 109 mg/dL (ref 70–139)

## 2024-01-24 ENCOUNTER — Ambulatory Visit: Payer: BC Managed Care – PPO | Admitting: Internal Medicine

## 2024-01-24 ENCOUNTER — Encounter: Payer: Self-pay | Admitting: Internal Medicine

## 2024-01-24 VITALS — BP 110/74 | HR 90 | Temp 98.0°F | Resp 16 | Ht 59.0 in | Wt 162.0 lb

## 2024-01-24 DIAGNOSIS — R002 Palpitations: Secondary | ICD-10-CM

## 2024-01-24 DIAGNOSIS — F419 Anxiety disorder, unspecified: Secondary | ICD-10-CM | POA: Diagnosis not present

## 2024-01-24 NOTE — Progress Notes (Signed)
 Subjective:    Patient ID: Kelly Eaton, female    DOB: 08-27-1996, 28 y.o.   MRN: [id number]  Patient here for  Chief Complaint  Patient presents with   Medical Management of Chronic Issues    HPI Here for a scheduled follow up - pregnant. Saw OB 01/16/24. Reported some palpitations after eating. Worse with greasy food. Advised taking TUMS. Discussed with her today. She does not feel TUMS helps. States has had acid reflux previously. Reports this feels different. No chest pain. Breathing stable.  No symptoms with her exercise and work outs. Saw pulmonary 11/04/23 - recommended HST.  Had HST - states was ok.    Past Medical History:  Diagnosis Date   Anxiety    Chest pain    Depression    Frequent headaches    cluster headaches.  had MRI.    Hx of migraines    Past Surgical History:  Procedure Laterality Date   CESAREAN SECTION     MOUTH SURGERY  10/02/2011   Family History  Problem Relation Age of Onset   Heart disease Maternal Grandfather    Hypertension Maternal Grandfather    Heart attack Maternal Grandfather    Alcohol abuse Paternal Grandfather    Hypertension Paternal Grandfather    Early death Paternal Grandfather    Heart attack Paternal Grandfather    Social History   Socioeconomic History   Marital status: Married    Spouse name: Fabio Holts   Number of children: 1   Years of education: Not on file   Highest education level: Bachelor's degree (e.g., BA, AB, BS)  Occupational History   Not on file  Tobacco Use   Smoking status: Never    Passive exposure: Never   Smokeless tobacco: Never  Vaping Use   Vaping status: Never Used  Substance and Sexual Activity   Alcohol use: Not Currently   Drug use: Never   Sexual activity: Yes    Birth control/protection: None  Other Topics Concern   Not on file  Social History Narrative   Not on file   Social Drivers of Health   Financial Resource Strain: Low Risk  (03/20/2023)   Overall Financial Resource Strain  (CARDIA)    Difficulty of Paying Living Expenses: Not hard at all  Food Insecurity: No Food Insecurity (10/21/2023)   Hunger Vital Sign    Worried About Running Out of Food in the Last Year: Never true    Ran Out of Food in the Last Year: Never true  Transportation Needs: No Transportation Needs (10/21/2023)   PRAPARE - Administrator, Civil Service (Medical): No    Lack of Transportation (Non-Medical): No  Physical Activity: Sufficiently Active (03/20/2023)   Exercise Vital Sign    Days of Exercise per Week: 5 days    Minutes of Exercise per Session: 30 min  Stress: Stress Concern Present (10/21/2023)   [employer] of Occupational Health - Occupational Stress Questionnaire    Feeling of Stress : Rather much  Social Connections: Moderately Isolated (03/20/2023)   Social Connection and Isolation Panel [NHANES]    Frequency of Communication with Friends and Family: More than three times a week    Frequency of Social Gatherings with Friends and Family: More than three times a week    Attends Religious Services: Never    Database administrator or Organizations: No    Attends Engineer, structural: Not on file    Marital Status: Married  Review of Systems  Constitutional:  Negative for appetite change and unexpected weight change.  HENT:  Negative for congestion and sinus pressure.   Respiratory:  Negative for cough, chest tightness and shortness of breath.   Cardiovascular:  Positive for palpitations. Negative for chest pain and leg swelling.  Gastrointestinal:  Negative for abdominal pain, diarrhea, nausea and vomiting.  Genitourinary:  Negative for difficulty urinating and dysuria.  Musculoskeletal:  Negative for joint swelling and myalgias.  Skin:  Negative for color change and rash.  Neurological:  Negative for dizziness and headaches.  Psychiatric/Behavioral:  Negative for agitation and dysphoric mood.        Objective:     BP 110/74   Pulse 90    Temp 98 F (36.7 C)   Resp 16   Ht 4\' 11"  (1.499 m)   Wt 162 lb (73.5 kg)   LMP 08/28/2023 (Exact Date)   SpO2 98%   BMI 32.72 kg/m  Wt Readings from Last 3 Encounters:  01/24/24 162 lb (73.5 kg)  01/16/24 162 lb 4.8 oz (73.6 kg)  12/19/23 162 lb 9.6 oz (73.8 kg)    Physical Exam Vitals reviewed.  Constitutional:      General: She is not in acute distress.    Appearance: Normal appearance.  HENT:     Head: Normocephalic and atraumatic.     Right Ear: External ear normal.     Left Ear: External ear normal.     Mouth/Throat:     Pharynx: No oropharyngeal exudate or posterior oropharyngeal erythema.  Eyes:     General: No scleral icterus.       Right eye: No discharge.        Left eye: No discharge.     Conjunctiva/sclera: Conjunctivae normal.  Neck:     Thyroid : No thyromegaly.  Cardiovascular:     Rate and Rhythm: Normal rate and regular rhythm.  Pulmonary:     Effort: No respiratory distress.     Breath sounds: Normal breath sounds. No wheezing.  Abdominal:     General: Bowel sounds are normal.     Palpations: Abdomen is soft.     Tenderness: There is no abdominal tenderness.  Musculoskeletal:        General: No swelling or tenderness.     Cervical back: Neck supple. No tenderness.  Lymphadenopathy:     Cervical: No cervical adenopathy.  Skin:    Findings: No erythema or rash.  Neurological:     Mental Status: She is alert.  Psychiatric:        Mood and Affect: Mood normal.        Behavior: Behavior normal.         Outpatient Encounter Medications as of 01/24/2024  Medication Sig   Prenatal Vit-Fe Fumarate-FA (PRENATAL PO) Take by mouth daily.   No facility-administered encounter medications on file as of 01/24/2024.     Lab Results  Component Value Date   WBC 10.0 11/22/2023   HGB 13.6 11/22/2023   HCT 39.5 11/22/2023   PLT 255 11/22/2023   GLUCOSE 79 06/19/2016   CHOL 146 11/15/2015   TRIG 79.0 11/15/2015   HDL 54.90 11/15/2015   LDLCALC 76  11/15/2015   ALT 13 06/19/2016   AST 16 06/19/2016   NA 139 06/19/2016   K 3.9 06/19/2016   CL 105 06/19/2016   CREATININE 1.01 06/19/2016   BUN 22 06/19/2016   CO2 27 06/19/2016   TSH 0.861 10/31/2021   GLUF 71 01/16/2024  Assessment & Plan:  Anxiety Assessment & Plan: Taking lexapro . Feels she is doing well on this medication. Continue f/u with psychiatry.    Palpitations Assessment & Plan: Saw her OB. Treated with TUMS. They felt acid reflux aggravating. Discussed further w/up , including zio monitor. Reports had recent labs. Wants to hold on further w/up. Wants to monitor.       Dellar Fenton, MD

## 2024-01-25 ENCOUNTER — Encounter: Payer: Self-pay | Admitting: Internal Medicine

## 2024-01-25 DIAGNOSIS — R002 Palpitations: Secondary | ICD-10-CM | POA: Insufficient documentation

## 2024-01-25 NOTE — Assessment & Plan Note (Addendum)
 Taking lexapro . Feels she is doing well on this medication. Continue f/u with psychiatry.

## 2024-01-25 NOTE — Assessment & Plan Note (Signed)
 Saw her OB. Treated with TUMS. They felt acid reflux aggravating. Discussed further w/up , including zio monitor. Reports had recent labs. Wants to hold on further w/up. Wants to monitor.

## 2024-02-04 ENCOUNTER — Other Ambulatory Visit: Payer: Self-pay

## 2024-02-04 DIAGNOSIS — Z3482 Encounter for supervision of other normal pregnancy, second trimester: Secondary | ICD-10-CM

## 2024-02-05 ENCOUNTER — Other Ambulatory Visit: Payer: Self-pay

## 2024-02-05 DIAGNOSIS — O219 Vomiting of pregnancy, unspecified: Secondary | ICD-10-CM

## 2024-02-05 MED ORDER — ONDANSETRON HCL 4 MG PO TABS: 4.0000 mg | ORAL_TABLET | Freq: Three times a day (TID) | ORAL | 0 refills | Status: DC | PRN

## 2024-02-05 NOTE — Telephone Encounter (Signed)
 Pt called reporting she thinks she has food poisoning and needed to know what she can do. Advised to drink small frequents sips of fluid and try crackers, stay hydrated, and nausea meds sent in to help.

## 2024-02-13 ENCOUNTER — Ambulatory Visit (INDEPENDENT_AMBULATORY_CARE_PROVIDER_SITE_OTHER)

## 2024-02-13 VITALS — BP 108/71 | HR 83 | Wt 164.8 lb

## 2024-02-13 DIAGNOSIS — Z113 Encounter for screening for infections with a predominantly sexual mode of transmission: Secondary | ICD-10-CM | POA: Diagnosis not present

## 2024-02-13 DIAGNOSIS — R002 Palpitations: Secondary | ICD-10-CM

## 2024-02-13 DIAGNOSIS — Z131 Encounter for screening for diabetes mellitus: Secondary | ICD-10-CM | POA: Diagnosis not present

## 2024-02-13 DIAGNOSIS — Z130 Encounter for screening for diseases of the blood and blood-forming organs and certain disorders involving the immune mechanism: Secondary | ICD-10-CM | POA: Diagnosis not present

## 2024-02-13 DIAGNOSIS — Z3480 Encounter for supervision of other normal pregnancy, unspecified trimester: Secondary | ICD-10-CM | POA: Diagnosis not present

## 2024-02-13 DIAGNOSIS — Z0289 Encounter for other administrative examinations: Secondary | ICD-10-CM

## 2024-02-13 DIAGNOSIS — Z98891 History of uterine scar from previous surgery: Secondary | ICD-10-CM

## 2024-02-13 NOTE — Assessment & Plan Note (Signed)
-   Reviewed need to schedule next appointment with MD for [facility] counseling.

## 2024-02-13 NOTE — Progress Notes (Signed)
    Return Prenatal Note   Assessment/Plan   Plan  28 y.o. G2P1001 at [redacted]w[redacted]d presents for follow-up OB visit. Reviewed prenatal record including previous visit note.  Supervision of normal pregnancy - Prepared for 1 hour glucola, third trimester labs, and Tdap at next visit.  - Reviewed red flag warning signs anticipatory guidance for upcoming prenatal care.   History of cesarean delivery - Reviewed need to schedule next appointment with MD for [facility] counseling.   Palpitations - Continues to notice what she believes are palpitations. Over this past weekend, she had two days where the feeling was almost constant. She has tried TUMs and famotidine without relief. Saw her PCP on 4/25 who offered zio monitor to assess for arrhythmia. She declined at the time because she wanted to give other interventions more time but now is open to further assessment. She plans to reach out to her doctor to set up monitoring.    Orders Placed This Encounter  Procedures   28 Week RH+Panel    Standing Status:   Future    Expected Date:   03/15/2024    Expiration Date:   02/12/2025   Return in about 4 weeks (around 03/12/2024) for ROB with MD for TOLAC counseling with 1 hour glucola.   Future Appointments  Date Time Provider Department Center  02/20/2024  4:00 PM [facility]  07/27/2024  9:00 AM Dellar Fenton, MD [facility]    For next visit:  ROB with 1 hour gluocla, third trimester labs, and Tdap     Subjective   28 y.o. G2P1001 at [redacted]w[redacted]d presents for this follow-up prenatal visit.  Patient continues to have palpitations that are unrelieved by comfort measures that have been recommended.  Patient reports: Movement: Present Contractions: Not present  Objective   Flow sheet Vitals: Pulse Rate: 83 BP: 108/71 Fundal Height: 24 cm Fetal Heart Rate (bpm): 145 Total weight gain: 8 lb 12.8 oz (3.992 kg)  General Appearance  No acute distress, well appearing, and well  nourished Pulmonary   Normal work of breathing Neurologic   Alert and oriented to person, place, and time Psychiatric   Mood and affect within normal limits  Kelly Eaton, CNM  05/15/254:12 PM

## 2024-02-13 NOTE — Assessment & Plan Note (Signed)
-   Continues to notice what she believes are palpitations. Over this past weekend, she had two days where the feeling was almost constant. She has tried TUMs and famotidine without relief. Saw her PCP on 4/25 who offered zio monitor to assess for arrhythmia. She declined at the time because she wanted to give other interventions more time but now is open to further assessment. She plans to reach out to her doctor to set up monitoring.

## 2024-02-13 NOTE — Assessment & Plan Note (Addendum)
-   Prepared for 1 hour glucola, third trimester labs, and Tdap at next visit.  -Reviewed red flag warning signs anticipatory guidance for upcoming prenatal care.

## 2024-02-20 ENCOUNTER — Ambulatory Visit
Admission: RE | Admit: 2024-02-20 | Discharge: 2024-02-20 | Disposition: A | Discharge status: Home / Self Care | Source: Ambulatory Visit

## 2024-02-20 DIAGNOSIS — Z3482 Encounter for supervision of other normal pregnancy, second trimester: Secondary | ICD-10-CM | POA: Insufficient documentation

## 2024-02-20 DIAGNOSIS — Z3A24 24 weeks gestation of pregnancy: Secondary | ICD-10-CM | POA: Insufficient documentation

## 2024-02-20 DIAGNOSIS — Z3689 Encounter for other specified antenatal screening: Secondary | ICD-10-CM | POA: Diagnosis not present

## 2024-02-20 DIAGNOSIS — Z363 Encounter for antenatal screening for malformations: Secondary | ICD-10-CM | POA: Insufficient documentation

## 2024-03-12 ENCOUNTER — Ambulatory Visit: Admitting: Obstetrics

## 2024-03-12 ENCOUNTER — Other Ambulatory Visit

## 2024-03-12 VITALS — BP 106/77 | HR 82 | Wt 165.0 lb

## 2024-03-12 DIAGNOSIS — Z130 Encounter for screening for diseases of the blood and blood-forming organs and certain disorders involving the immune mechanism: Secondary | ICD-10-CM

## 2024-03-12 DIAGNOSIS — Z131 Encounter for screening for diabetes mellitus: Secondary | ICD-10-CM

## 2024-03-12 DIAGNOSIS — Z23 Encounter for immunization: Secondary | ICD-10-CM

## 2024-03-12 DIAGNOSIS — R002 Palpitations: Secondary | ICD-10-CM

## 2024-03-12 DIAGNOSIS — Z3A28 28 weeks gestation of pregnancy: Secondary | ICD-10-CM

## 2024-03-12 DIAGNOSIS — Z113 Encounter for screening for infections with a predominantly sexual mode of transmission: Secondary | ICD-10-CM

## 2024-03-12 DIAGNOSIS — O34219 Maternal care for unspecified type scar from previous cesarean delivery: Secondary | ICD-10-CM | POA: Diagnosis not present

## 2024-03-12 DIAGNOSIS — Z98891 History of uterine scar from previous surgery: Secondary | ICD-10-CM

## 2024-03-12 DIAGNOSIS — Z3480 Encounter for supervision of other normal pregnancy, unspecified trimester: Secondary | ICD-10-CM

## 2024-03-12 DIAGNOSIS — Z3483 Encounter for supervision of other normal pregnancy, third trimester: Secondary | ICD-10-CM

## 2024-03-12 NOTE — Progress Notes (Signed)
    Return Prenatal Note   Subjective  28 y.o. G2P1001 at [redacted]w[redacted]d presents for this follow-up prenatal visit. Pregnancy notable for prior CD x 1 desiring TOLAC,   Patient feeling well today. Would like to discuss desire for TOLAC. pLTCS done for NRFHT during second stage while pushing, thought to be due to a tight nuchal. Also expresses desire for epidural for labor and earlier; first labor she waited as long as she could and regrets this. Pt requesting baby to NOT be born on 06/07/24 for personal reasons, EDD 06/03/24.   Patient reports: Movement: Present Contractions: Not present Denies vaginal bleeding or leaking fluid. Objective  Flow sheet Vitals: Pulse Rate: 82 BP: 106/77 Total weight gain: 9 lb (4.082 kg)  General Appearance  No acute distress, well appearing, and well nourished Pulmonary   Normal work of breathing Neurologic   Alert and oriented to person, place, and time Psychiatric   Mood and affect within normal limits   Assessment/Plan   Plan  28 y.o. G2P1001 at [redacted]w[redacted]d by LMP=11wk US  presents for follow-up OB visit. Reviewed prenatal record including previous visit note.  1. Encounter for supervision of other normal pregnancy in third trimester (Primary) -28wk labs, Tdap and BTC completed today -Third trimester anticipatory guidance given -We discussed her desire to avoid delivery on 9/7. Reviewed limitations with a TOLAC IOL. Will discuss further as pregnancy progresses.   2. History of cesarean delivery -VBAC calculator = 49.4% -Counseled regarding TOLAC vs RCS; risks/benefits discussed in detail. All questions answered.  Patient elects for TOLAC, consent signed 03/12/2024 and scanned to chart.   3. Palpitations -Schedule appointment for heart monitor   Orders Placed This Encounter  Procedures   Tdap vaccine greater than or equal to 7yo IM   Return in about 2 weeks (around 03/26/2024) for ROB.   Future Appointments  Date Time Provider Department Center   03/26/2024 11:15 AM Phylliss Brenner, CNM [facility] None  07/27/2024  9:00 AM Dellar Fenton, MD [facility]    For next visit:  continue with routine prenatal care   Sofia Dunn, DO [facility]

## 2024-03-12 NOTE — Patient Instructions (Signed)
 Tdap (Tetanus, Diphtheria, Pertussis) Vaccine: What You Need to Know Many vaccine information statements are available in Spanish and other languages. See PromoAge.com.br. 1. Why get vaccinated? Tdap vaccine can prevent tetanus, diphtheria, and pertussis. Diphtheria and pertussis spread from person to person. Tetanus enters the body through cuts or wounds. TETANUS (T) causes painful stiffening of the muscles. Tetanus can lead to serious health problems, including being unable to open the mouth, having trouble swallowing and breathing, or death. DIPHTHERIA (D) can lead to difficulty breathing, heart failure, paralysis, or death. PERTUSSIS (aP), also known as "whooping cough," can cause uncontrollable, violent coughing that makes it hard to breathe, eat, or drink. Pertussis can be extremely serious especially in babies and young children, causing pneumonia, convulsions, brain damage, or death. In teens and adults, it can cause weight loss, loss of bladder control, passing out, and rib fractures from severe coughing. 2. Tdap vaccine Tdap is only for children 7 years and older, adolescents, and adults.  Adolescents should receive a single dose of Tdap, preferably at age 76 or 12 years. Pregnant people should get a dose of Tdap during every pregnancy, preferably during the early part of the third trimester, to help protect the newborn from pertussis. Infants are most at risk for severe, life-threatening complications from pertussis. Adults who have never received Tdap should get a dose of Tdap. Also, adults should receive a booster dose of either Tdap or Td (a different vaccine that protects against tetanus and diphtheria but not pertussis) every 10 years, or after 5 years in the case of a severe or dirty wound or burn. Tdap may be given at the same time as other vaccines. 3. Talk with your health care provider Tell your vaccine provider if the person getting the vaccine: Has had an allergic  reaction after a previous dose of any vaccine that protects against tetanus, diphtheria, or pertussis, or has any severe, life-threatening allergies Has had a coma, decreased level of consciousness, or prolonged seizures within 7 days after a previous dose of any pertussis vaccine (DTP, DTaP, or Tdap) Has seizures or another nervous system problem Has ever had Guillain-Barr Syndrome (also called "GBS") Has had severe pain or swelling after a previous dose of any vaccine that protects against tetanus or diphtheria In some cases, your health care provider may decide to postpone Tdap vaccination until a future visit. People with minor illnesses, such as a cold, may be vaccinated. People who are moderately or severely ill should usually wait until they recover before getting Tdap vaccine.  Your health care provider can give you more information. 4. Risks of a vaccine reaction Pain, redness, or swelling where the shot was given, mild fever, headache, feeling tired, and nausea, vomiting, diarrhea, or stomachache sometimes happen after Tdap vaccination. People sometimes faint after medical procedures, including vaccination. Tell your provider if you feel dizzy or have vision changes or ringing in the ears.  As with any medicine, there is a very remote chance of a vaccine causing a severe allergic reaction, other serious injury, or death. 5. What if there is a serious problem? An allergic reaction could occur after the vaccinated person leaves the clinic. If you see signs of a severe allergic reaction (hives, swelling of the face and throat, difficulty breathing, a fast heartbeat, dizziness, or weakness), call 9-1-1 and get the person to the nearest hospital. For other signs that concern you, call your health care provider.  Adverse reactions should be reported to the Vaccine Adverse Event Reporting  System (VAERS). Your health care provider will usually file this report, or you can do it yourself. Visit the  VAERS website at www.vaers.LAgents.no or call 437-731-6503. VAERS is only for reporting reactions, and VAERS staff members do not give medical advice. 6. The National Vaccine Injury Compensation Program The [employer] Vaccine Injury Compensation Program (VICP) is a federal program that was created to compensate people who may have been injured by certain vaccines. Claims regarding alleged injury or death due to vaccination have a time limit for filing, which may be as short as two years. Visit the VICP website at SpiritualWord.at or call (669) 837-1631 to learn about the program and about filing a claim. 7. How can I learn more? Ask your health care provider. Call your local or state health department. Visit the website of the Food and Drug Administration (FDA) for vaccine package inserts and additional information at FinderList.no. Contact the Centers for Disease Control and Prevention (CDC): Call (484) 483-2759 (1-800-CDC-INFO) or Visit CDC's website at PicCapture.uy. Source: CDC Vaccine Information Statement Tdap (Tetanus, Diphtheria, Pertussis) Vaccine (05/06/2020) This same material is available at FootballExhibition.com.br for no charge. This information is not intended to replace advice given to you by your health care provider. Make sure you discuss any questions you have with your health care provider. Document Revised: 01/02/2023 Document Reviewed: 11/02/2022 Elsevier Patient Education  2024 [employer].

## 2024-03-13 ENCOUNTER — Ambulatory Visit: Payer: Self-pay

## 2024-03-13 DIAGNOSIS — Z3482 Encounter for supervision of other normal pregnancy, second trimester: Secondary | ICD-10-CM

## 2024-03-13 LAB — 28 WEEK RH+PANEL
Basophils Absolute: 0 10*3/uL (ref 0.0–0.2)
Basos: 0 %
EOS (ABSOLUTE): 0.1 10*3/uL (ref 0.0–0.4)
Eos: 1 %
Gestational Diabetes Screen: 142 mg/dL — ABNORMAL HIGH (ref 70–139)
HIV Screen 4th Generation wRfx: NONREACTIVE
Hematocrit: 37 % (ref 34.0–46.6)
Hemoglobin: 11.8 g/dL (ref 11.1–15.9)
Immature Grans (Abs): 0 10*3/uL (ref 0.0–0.1)
Immature Granulocytes: 0 %
Lymphocytes Absolute: 0.9 10*3/uL (ref 0.7–3.1)
Lymphs: 9 %
MCH: 32.5 pg (ref 26.6–33.0)
MCHC: 31.9 g/dL (ref 31.5–35.7)
MCV: 102 fL — ABNORMAL HIGH (ref 79–97)
Monocytes Absolute: 0.3 10*3/uL (ref 0.1–0.9)
Monocytes: 3 %
Neutrophils Absolute: 9.2 10*3/uL — ABNORMAL HIGH (ref 1.4–7.0)
Neutrophils: 87 %
Platelets: 252 10*3/uL (ref 150–450)
RBC: 3.63 x10E6/uL — ABNORMAL LOW (ref 3.77–5.28)
RDW: 12.7 % (ref 11.7–15.4)
RPR Ser Ql: NONREACTIVE
WBC: 10.5 10*3/uL (ref 3.4–10.8)

## 2024-03-16 ENCOUNTER — Telehealth: Payer: Self-pay

## 2024-03-16 NOTE — Telephone Encounter (Signed)
 Reached out to pt to schedule the 3 hour glucose test per [facility].  Left message for pt to call back to schedule.

## 2024-03-18 ENCOUNTER — Other Ambulatory Visit: Payer: Self-pay

## 2024-03-18 ENCOUNTER — Other Ambulatory Visit

## 2024-03-18 DIAGNOSIS — O99810 Abnormal glucose complicating pregnancy: Secondary | ICD-10-CM

## 2024-03-18 DIAGNOSIS — Z3482 Encounter for supervision of other normal pregnancy, second trimester: Secondary | ICD-10-CM

## 2024-03-19 ENCOUNTER — Ambulatory Visit: Payer: Self-pay

## 2024-03-19 DIAGNOSIS — Z8632 Personal history of gestational diabetes: Secondary | ICD-10-CM | POA: Insufficient documentation

## 2024-03-19 DIAGNOSIS — O24419 Gestational diabetes mellitus in pregnancy, unspecified control: Secondary | ICD-10-CM | POA: Insufficient documentation

## 2024-03-19 LAB — GESTATIONAL GLUCOSE TOLERANCE
Glucose, Fasting: 79 mg/dL (ref 70–94)
Glucose, GTT - 1 Hour: 191 mg/dL — ABNORMAL HIGH (ref 70–179)
Glucose, GTT - 2 Hour: 163 mg/dL — ABNORMAL HIGH (ref 70–154)
Glucose, GTT - 3 Hour: 120 mg/dL (ref 70–139)

## 2024-03-19 MED ORDER — LANCET DEVICE MISC
1.0000 | Freq: Three times a day (TID) | 0 refills | Status: AC
Start: 2024-03-19 — End: 2024-04-18

## 2024-03-19 MED ORDER — BLOOD GLUCOSE MONITORING SUPPL DEVI: 1.0000 | Freq: Three times a day (TID) | 0 refills | Status: DC

## 2024-03-19 MED ORDER — BLOOD GLUCOSE TEST VI STRP: 1.0000 | ORAL_STRIP | Freq: Four times a day (QID) | 4 refills | Status: DC

## 2024-03-19 MED ORDER — LANCETS MISC. MISC
1.0000 | Freq: Four times a day (QID) | 4 refills | Status: AC
Start: 2024-03-19 — End: 2024-04-18

## 2024-03-20 NOTE — Telephone Encounter (Signed)
 Pt completed the 3 hr glucose on 03/18/2024

## 2024-03-25 ENCOUNTER — Encounter: Admitting: Dietician

## 2024-03-25 ENCOUNTER — Encounter: Payer: Self-pay | Admitting: Dietician

## 2024-03-25 DIAGNOSIS — Z713 Dietary counseling and surveillance: Secondary | ICD-10-CM | POA: Insufficient documentation

## 2024-03-25 DIAGNOSIS — O24419 Gestational diabetes mellitus in pregnancy, unspecified control: Secondary | ICD-10-CM | POA: Insufficient documentation

## 2024-03-25 DIAGNOSIS — Z3A29 29 weeks gestation of pregnancy: Secondary | ICD-10-CM | POA: Diagnosis not present

## 2024-03-25 NOTE — Progress Notes (Signed)
 Patient was seen on 03/25/2024 for Gestational Diabetes self-management class at the [facility]. The following learning objectives were met by the patient during this course:  States the definition of Gestational Diabetes States why dietary management is important in controlling blood glucose Describes the effects each nutrient has on blood glucose levels Demonstrates ability to create a balanced meal plan Demonstrates carbohydrate counting  States when to check blood glucose levels Demonstrates proper blood glucose monitoring techniques States the effect of stress and exercise on blood glucose levels States the importance of limiting caffeine and abstaining from alcohol and smoking  Patient has a meter prior to visit. Patient is testing pre breakfast and 2 hours after each meal. FBS: 85-90 mg/dL Postprandial: 899-889 mg/dL   Patient instructed to monitor glucose levels: FBS: 60 - <90 1 hour: <140 2 hour: <120  *Patient received handouts: Nutrition Diabetes and Pregnancy Carbohydrate Counting List Blood glucose log Snack ideas for diabetes during pregnancy  Patient will be seen for follow-up as needed.

## 2024-03-26 ENCOUNTER — Ambulatory Visit: Admitting: Obstetrics

## 2024-03-26 ENCOUNTER — Encounter: Payer: Self-pay | Admitting: Obstetrics

## 2024-03-26 VITALS — BP 101/68 | HR 85 | Wt 163.0 lb

## 2024-03-26 DIAGNOSIS — O34219 Maternal care for unspecified type scar from previous cesarean delivery: Secondary | ICD-10-CM | POA: Diagnosis not present

## 2024-03-26 DIAGNOSIS — Z3490 Encounter for supervision of normal pregnancy, unspecified, unspecified trimester: Secondary | ICD-10-CM

## 2024-03-26 DIAGNOSIS — O24419 Gestational diabetes mellitus in pregnancy, unspecified control: Secondary | ICD-10-CM

## 2024-03-26 DIAGNOSIS — R002 Palpitations: Secondary | ICD-10-CM

## 2024-03-26 DIAGNOSIS — Z3A30 30 weeks gestation of pregnancy: Secondary | ICD-10-CM

## 2024-03-26 DIAGNOSIS — Z98891 History of uterine scar from previous surgery: Secondary | ICD-10-CM

## 2024-03-26 NOTE — Assessment & Plan Note (Signed)
-  TOLAC consent completed 03/12/24

## 2024-03-26 NOTE — Assessment & Plan Note (Signed)
-  Most blood sugar values have been within normal. Lifestyles class completed. Has been making appropriate dietary changes.

## 2024-03-26 NOTE — Progress Notes (Signed)
    Return Prenatal Note   Assessment/Plan   Plan  28 y.o. G2P1001 at [redacted]w[redacted]d presents for follow-up OB visit. Reviewed prenatal record including previous visit note.  History of cesarean delivery -TOLAC consent completed 03/12/24  Gestational diabetes mellitus (GDM) affecting pregnancy -Most blood sugar values have been within normal. Lifestyles class completed. Has been making appropriate dietary changes.  Supervision of normal pregnancy -Discussed comfort measures for lower abdominal pain. Reassured that is is not due to uterine rupture -Anticipatory guidance about third trimester -Reviewed kick counts and preterm labor warning signs. Instructed to call office or come to hospital with persistent headache, vision changes, regular contractions, leaking of fluid, decreased fetal movement or vaginal bleeding.     No orders of the defined types were placed in this encounter.  Return in about 2 weeks (around 04/09/2024).   Future Appointments  Date Time Provider Department Center  04/10/2024 10:15 AM Eaton Kelly HERO, CNM [facility] None  07/27/2024  9:00 AM Kelly Shad, MD [facility]    For next visit:  Routine prenatal care    Subjective  Kelly Eaton has been checking her sugars and adjusting her diet accordingly. She has been feeling an intermittent pain in her lower left abdomen that is sharp. It is sometimes brief and sometimes lasts up to 30 minutes. She is feeling concerned about uterine rupture after her TOLAC counseling.  Movement: Present Contractions: Irritability  Objective   Flow sheet Vitals: Pulse Rate: 85 BP: 101/68 Fundal Height: 30 cm Fetal Heart Rate (bpm): 144 Total weight gain: 7 lb (3.175 kg)  General Appearance  No acute distress, well appearing, and well nourished Pulmonary   Normal work of breathing Neurologic   Alert and oriented to person, place, and time Psychiatric   Mood and affect within normal limits [place]   Soft, gravid, non-tender, no  localized pain with palpation  Kelly Eaton, CNM 03/26/24 11:40 AM

## 2024-03-26 NOTE — Assessment & Plan Note (Signed)
-  Discussed comfort measures for lower abdominal pain. Reassured that is is not due to uterine rupture -Anticipatory guidance about third trimester -Reviewed kick counts and preterm labor warning signs. Instructed to call office or come to hospital with persistent headache, vision changes, regular contractions, leaking of fluid, decreased fetal movement or vaginal bleeding.

## 2024-04-10 ENCOUNTER — Encounter: Payer: Self-pay | Admitting: Obstetrics

## 2024-04-10 ENCOUNTER — Ambulatory Visit (INDEPENDENT_AMBULATORY_CARE_PROVIDER_SITE_OTHER): Admitting: Obstetrics

## 2024-04-10 VITALS — BP 104/71 | HR 92 | Wt 161.0 lb

## 2024-04-10 DIAGNOSIS — O99343 Other mental disorders complicating pregnancy, third trimester: Secondary | ICD-10-CM | POA: Diagnosis not present

## 2024-04-10 DIAGNOSIS — Z3A32 32 weeks gestation of pregnancy: Secondary | ICD-10-CM | POA: Diagnosis not present

## 2024-04-10 DIAGNOSIS — Z3490 Encounter for supervision of normal pregnancy, unspecified, unspecified trimester: Secondary | ICD-10-CM

## 2024-04-10 DIAGNOSIS — Z98891 History of uterine scar from previous surgery: Secondary | ICD-10-CM

## 2024-04-10 DIAGNOSIS — O24419 Gestational diabetes mellitus in pregnancy, unspecified control: Secondary | ICD-10-CM | POA: Diagnosis not present

## 2024-04-10 DIAGNOSIS — F331 Major depressive disorder, recurrent, moderate: Secondary | ICD-10-CM

## 2024-04-10 NOTE — Assessment & Plan Note (Signed)
-  Discussed increasing Lexapro  if needed. Offered to switch back to Effexor, but Sunshyne prefers to wait until PP -Seeing therapist regularly

## 2024-04-10 NOTE — Assessment & Plan Note (Signed)
-  Reviewed BS log. More than 50% are WNL. -Encouraged continued dietary mindfulness

## 2024-04-10 NOTE — Progress Notes (Signed)
    Return Prenatal Note   Assessment/Plan   Plan  28 y.o. G2P1001 at [redacted]w[redacted]d presents for follow-up OB visit. Reviewed prenatal record including previous visit note.  Gestational diabetes mellitus (GDM) affecting pregnancy -Reviewed BS log. More than 50% are WNL. -Encouraged continued dietary mindfulness  Depression -Discussed increasing Lexapro  if needed. Offered to switch back to Effexor, but Kelly Eaton prefers to wait until PP -Seeing therapist regularly  Supervision of normal pregnancy -Reviewed labor warning signs. Instructed to call office or come to hospital with persistent headache, vision changes, regular contractions, leaking of fluid, decreased fetal movement or vaginal bleeding.     No orders of the defined types were placed in this encounter.  Return in about 2 weeks (around 04/24/2024).   Future Appointments  Date Time Provider Department Center  04/27/2024  1:55 PM Sebastian Sham, CNM [facility] None  07/27/2024  9:00 AM [name], MD [facility]    For next visit:  Routine prenatal care    Subjective   Kelly Eaton is doing okay. She is ready to stop being pregnant but is not looking forward to having another child. She has been managing her blood sugars well.  Movement: Present Contractions: Not present  Objective   Flow sheet Vitals: Pulse Rate: 92 BP: 104/71 Fundal Height: 31 cm Fetal Heart Rate (bpm): 150 Total weight gain: 5 lb (2.268 kg)  General Appearance  No acute distress, well appearing, and well nourished Pulmonary   Normal work of breathing Neurologic   Alert and oriented to person, place, and time Psychiatric   Mood and affect within normal limits  [name] Canny, CNM 04/10/24 5:29 PM

## 2024-04-10 NOTE — Assessment & Plan Note (Signed)
-  Reviewed labor warning signs. Instructed to call office or come to hospital with persistent headache, vision changes, regular contractions, leaking of fluid, decreased fetal movement or vaginal bleeding.

## 2024-04-10 NOTE — Progress Notes (Signed)
 Date Fasting Breakfast Lunch Dinner  6/25 83 126 85 107  6/26 85  95 139  113  6/28 88      6/29 96 153  88 133  6/30 77 80 134 98  7/12 83 145 108 138          Highest /lowest fasting:77/96 2 hour post prandial Highest/lowest Breakfast: Highest lowest Lunch: Highest/lowest Dinner:

## 2024-04-27 ENCOUNTER — Ambulatory Visit (INDEPENDENT_AMBULATORY_CARE_PROVIDER_SITE_OTHER): Admitting: Certified Nurse Midwife

## 2024-04-27 VITALS — BP 114/75 | HR 87 | Wt 162.5 lb

## 2024-04-27 DIAGNOSIS — Z3490 Encounter for supervision of normal pregnancy, unspecified, unspecified trimester: Secondary | ICD-10-CM | POA: Diagnosis not present

## 2024-04-27 DIAGNOSIS — Z3A34 34 weeks gestation of pregnancy: Secondary | ICD-10-CM | POA: Diagnosis not present

## 2024-04-27 NOTE — Progress Notes (Signed)
 Rob, doing well. BS log reviewed. Log updated from 7/21 to 7/28 . She has had a few elevated Lunch and dinner readings. She notes that she was on vacation and was not following diet as closely. Encouraged her to return to diet this week now that she has returned from vacation. She is in agreement. Discussed GBS/ swabs next visit. Follow up  2 week ROB.    Date Fasting Breakfast Lunch Dinner  7/11 84  138 123  7/12 88   77  124  7/13 89 103   83  7/14 82  104 156  7/15 92 119 110 137  7/16 88 98 157 102  7/17 93 86 185 121  7/18 89 108 123 144  7/19 92 95 180 104  7/20 89 91 132 113  7/21 94 98 120 129  7/22 100  117   7/23 89 110  96  7/24 89  132 101  7/25 89 93  132  7/26 91  123 94  7/27 90 105 85 88  7/28 90 115                                    Kelly Eaton, CNM

## 2024-05-12 ENCOUNTER — Ambulatory Visit (INDEPENDENT_AMBULATORY_CARE_PROVIDER_SITE_OTHER): Admitting: Obstetrics & Gynecology

## 2024-05-12 ENCOUNTER — Other Ambulatory Visit (HOSPITAL_COMMUNITY)
Admission: RE | Admit: 2024-05-12 | Discharge: 2024-05-12 | Disposition: A | Discharge status: Home / Self Care | Source: Ambulatory Visit | Attending: Obstetrics & Gynecology | Admitting: Obstetrics & Gynecology

## 2024-05-12 VITALS — BP 112/78 | HR 86 | Wt 165.0 lb

## 2024-05-12 DIAGNOSIS — Z112 Encounter for screening for other bacterial diseases: Secondary | ICD-10-CM

## 2024-05-12 DIAGNOSIS — Z113 Encounter for screening for infections with a predominantly sexual mode of transmission: Secondary | ICD-10-CM | POA: Insufficient documentation

## 2024-05-12 DIAGNOSIS — Z3A36 36 weeks gestation of pregnancy: Secondary | ICD-10-CM | POA: Diagnosis not present

## 2024-05-12 DIAGNOSIS — Z3483 Encounter for supervision of other normal pregnancy, third trimester: Secondary | ICD-10-CM

## 2024-05-12 DIAGNOSIS — Z369 Encounter for antenatal screening, unspecified: Secondary | ICD-10-CM

## 2024-05-12 DIAGNOSIS — Z98891 History of uterine scar from previous surgery: Secondary | ICD-10-CM

## 2024-05-12 DIAGNOSIS — O24419 Gestational diabetes mellitus in pregnancy, unspecified control: Secondary | ICD-10-CM

## 2024-05-12 NOTE — Progress Notes (Signed)
   PRENATAL VISIT NOTE  Subjective:  Kelly Eaton is a 28 y.o. G2P1001 at [redacted]w[redacted]d being seen today for ongoing prenatal care.  She is currently monitored for the following issues for this high-risk pregnancy and has Depression; Stress; Hearing loss; Weight gain; Encounter for completion of form with patient; Moderate episode of recurrent major depressive disorder (HCC); Low back pain; Ear fullness; Anxiety; Itchy scalp; Snoring; Supervision of normal pregnancy; History of cesarean delivery; Palpitations; and Gestational diabetes mellitus (GDM) affecting pregnancy on their problem list.  Patient reports no complaints.  Contractions: Irritability. Vag. Bleeding: None.  Movement: Present. Denies leaking of fluid.   The following portions of the patient's history were reviewed and updated as appropriate: allergies, current medications, past family history, past medical history, past social history, past surgical history and problem list.   Objective:    Vitals:   05/12/24 1556  BP: 112/78  Pulse: 86  Weight: 165 lb (74.8 kg)    Fetal Status:  Fetal Heart Rate (bpm): 135 Fundal Height: 35 cm Movement: Present Presentation: Vertex  General: Alert, oriented and cooperative. Patient is in no acute distress.  Skin: Skin is warm and dry. No rash noted.   Cardiovascular: Normal heart rate noted  Respiratory: Normal respiratory effort, no problems with respiration noted  Abdomen: Soft, gravid, appropriate for gestational age.  Pain/Pressure: Present     Pelvic: Cervical exam performed in the presence of a chaperone      1/th/ballotable  Extremities: Normal range of motion.     Mental Status: Normal mood and affect. Normal behavior. Normal judgment and thought content.   She had a painful bump on her vulva at the introitus, she says it's been there for about a year, very painful, sex is painful. On exam, there appeared to be a white foreign body about 5 mm long. I offered to remove it and she  agreed. I prepped the area with Hurricaine spray and injected 1 cc 2% lidocaine. I then teased out the foreign body with a #11 scapel. It turned out to be an open comedone. I achieved hemostasis with silver nitrate. She tolerated the procedure well.  Assessment and Plan:  Pregnancy: G2P1001 at [redacted]w[redacted]d 1. Encounter for supervision of other normal pregnancy in third trimester (Primary)   2. [redacted] weeks gestation of pregnancy   3. Antenatal screening encounter  - Strep Gp B NAA  4. Screening for STD (sexually transmitted disease)  - Cervicovaginal ancillary only  5. Screening for streptococcal infection  - Strep Gp B NAA  6. History of cesarean delivery   7. Gestational diabetes mellitus (GDM) affecting pregnancy - diet controlled, almost all recorded sugars are within range. Reassurance given.  Preterm labor symptoms and general obstetric precautions including but not limited to vaginal bleeding, contractions, leaking of fluid and fetal movement were reviewed in detail with the patient. Please refer to After Visit Summary for other counseling recommendations.   No follow-ups on file.  Future Appointments  Date Time Provider Department Center  07/27/2024  9:00 AM [name], MD [facility]    Harland JAYSON Birkenhead, MD

## 2024-05-14 LAB — CERVICOVAGINAL ANCILLARY ONLY
Chlamydia: NEGATIVE
Comment: NEGATIVE
Comment: NORMAL
Neisseria Gonorrhea: NEGATIVE

## 2024-05-14 LAB — STREP GP B NAA: Strep Gp B NAA: NEGATIVE

## 2024-05-21 ENCOUNTER — Encounter: Admitting: Obstetrics

## 2024-05-25 ENCOUNTER — Ambulatory Visit: Admitting: Certified Nurse Midwife

## 2024-05-25 ENCOUNTER — Encounter: Payer: Self-pay | Admitting: Certified Nurse Midwife

## 2024-05-25 VITALS — BP 130/87 | HR 77 | Wt 165.8 lb

## 2024-05-25 DIAGNOSIS — Z3A38 38 weeks gestation of pregnancy: Secondary | ICD-10-CM | POA: Diagnosis not present

## 2024-05-25 DIAGNOSIS — O24419 Gestational diabetes mellitus in pregnancy, unspecified control: Secondary | ICD-10-CM | POA: Diagnosis not present

## 2024-05-25 DIAGNOSIS — Z3490 Encounter for supervision of normal pregnancy, unspecified, unspecified trimester: Secondary | ICD-10-CM

## 2024-05-25 NOTE — Progress Notes (Unsigned)
    Return Prenatal Note   Subjective   28 y.o. G2P1001 at [redacted]w[redacted]d presents for this follow-up prenatal visit.  Patient is doing well. She has no new concerns. She would like to have a cervical exam today. Patient reports: Movement: Present Contractions: Irritability  Objective   Flow sheet Vitals: Pulse Rate: 77 BP: 130/87 Total weight gain: 9 lb 12.8 oz (4.445 kg)  General Appearance  No acute distress, well appearing, and well nourished Pulmonary   Normal work of breathing Neurologic   Alert and oriented to person, place, and time Psychiatric   Mood and affect within normal limits  Assessment/Plan   Plan  28 y.o. G2P1001 at [redacted]w[redacted]d presents for follow-up OB visit. Reviewed prenatal record including previous visit note.  No problem-specific Assessment & Plan notes found for this encounter.      No orders of the defined types were placed in this encounter.  No follow-ups on file.   Future Appointments  Date Time Provider Department Center  07/27/2024  9:00 AM [name], MD [facility]    For next visit:  continue with routine prenatal care     Damien Parsley, CNM [facility] 08/25/253:33 PM

## 2024-05-26 NOTE — Assessment & Plan Note (Signed)
 Feeling well with no concerns today.  Reviewed labor warning signs and expectations for birth. Instructed to call office or come to hospital with persistent headache, vision changes, regular contractions, leaking of fluid, decreased fetal movement or vaginal bleeding.

## 2024-05-26 NOTE — Assessment & Plan Note (Signed)
 Reviewed values with patient. She will send her log. All fastings below 95. She had a few elevated values after lunch but those happened after she ate higher carb meals.  IOL scheduled for 9/4 at 8am

## 2024-05-29 ENCOUNTER — Other Ambulatory Visit: Payer: Self-pay

## 2024-05-29 ENCOUNTER — Inpatient Hospital Stay: Admitting: Anesthesiology

## 2024-05-29 ENCOUNTER — Encounter: Payer: Self-pay | Admitting: Obstetrics

## 2024-05-29 ENCOUNTER — Inpatient Hospital Stay
Admission: EM | Admit: 2024-05-29 | Discharge: 2024-06-01 | DRG: 787 | Disposition: A | Discharge status: Home / Self Care | Attending: Advanced Practice Midwife | Admitting: Advanced Practice Midwife

## 2024-05-29 DIAGNOSIS — Z98891 History of uterine scar from previous surgery: Principal | ICD-10-CM

## 2024-05-29 DIAGNOSIS — Z8249 Family history of ischemic heart disease and other diseases of the circulatory system: Secondary | ICD-10-CM | POA: Diagnosis not present

## 2024-05-29 DIAGNOSIS — Z8632 Personal history of gestational diabetes: Secondary | ICD-10-CM | POA: Diagnosis present

## 2024-05-29 DIAGNOSIS — O34211 Maternal care for low transverse scar from previous cesarean delivery: Secondary | ICD-10-CM | POA: Diagnosis present

## 2024-05-29 DIAGNOSIS — Z3A39 39 weeks gestation of pregnancy: Secondary | ICD-10-CM

## 2024-05-29 DIAGNOSIS — O26893 Other specified pregnancy related conditions, third trimester: Secondary | ICD-10-CM | POA: Diagnosis present

## 2024-05-29 DIAGNOSIS — O9972 Diseases of the skin and subcutaneous tissue complicating childbirth: Secondary | ICD-10-CM | POA: Diagnosis present

## 2024-05-29 DIAGNOSIS — Z23 Encounter for immunization: Secondary | ICD-10-CM

## 2024-05-29 DIAGNOSIS — O24420 Gestational diabetes mellitus in childbirth, diet controlled: Principal | ICD-10-CM | POA: Diagnosis present

## 2024-05-29 DIAGNOSIS — L910 Hypertrophic scar: Secondary | ICD-10-CM | POA: Diagnosis present

## 2024-05-29 DIAGNOSIS — O24419 Gestational diabetes mellitus in pregnancy, unspecified control: Secondary | ICD-10-CM | POA: Diagnosis present

## 2024-05-29 LAB — TYPE AND SCREEN
ABO/RH(D): O POS
Antibody Screen: NEGATIVE

## 2024-05-29 LAB — CBC
HCT: 36.4 % (ref 36.0–46.0)
Hemoglobin: 13 g/dL (ref 12.0–15.0)
MCH: 33.5 pg (ref 26.0–34.0)
MCHC: 35.7 g/dL (ref 30.0–36.0)
MCV: 93.8 fL (ref 80.0–100.0)
Platelets: 234 K/uL (ref 150–400)
RBC: 3.88 MIL/uL (ref 3.87–5.11)
RDW: 13.2 % (ref 11.5–15.5)
WBC: 14 K/uL — ABNORMAL HIGH (ref 4.0–10.5)
nRBC: 0 % (ref 0.0–0.2)

## 2024-05-29 LAB — GLUCOSE, CAPILLARY: Glucose-Capillary: 157 mg/dL — ABNORMAL HIGH (ref 70–99)

## 2024-05-29 MED ORDER — OXYTOCIN BOLUS FROM INFUSION: 333.0000 mL | Freq: Once | INTRAVENOUS | Status: DC

## 2024-05-29 MED ORDER — LACTATED RINGERS IV SOLN: 500.0000 mL | INTRAVENOUS | Status: DC | PRN

## 2024-05-29 MED ORDER — EPHEDRINE 5 MG/ML INJ
10.0000 mg | INTRAVENOUS | Status: DC | PRN
  Administered 2024-05-29: 10 mg via INTRAVENOUS

## 2024-05-29 MED ORDER — LACTATED RINGERS IV SOLN: INTRAVENOUS | Status: DC

## 2024-05-29 MED ORDER — LIDOCAINE HCL (PF) 1 % IJ SOLN
INTRAMUSCULAR | Status: DC | PRN
  Administered 2024-05-29: 3 mL

## 2024-05-29 MED ORDER — FENTANYL-BUPIVACAINE-NACL 0.5-0.125-0.9 MG/250ML-% EP SOLN
EPIDURAL | Status: AC
  Filled 2024-05-29: qty 250

## 2024-05-29 MED ORDER — FENTANYL-BUPIVACAINE-NACL 0.5-0.125-0.9 MG/250ML-% EP SOLN: 12.0000 mL/h | EPIDURAL | Status: DC | PRN

## 2024-05-29 MED ORDER — FENTANYL-BUPIVACAINE-NACL 0.5-0.125-0.9 MG/250ML-% EP SOLN
EPIDURAL | Status: DC | PRN
  Administered 2024-05-29: 12 mL/h via EPIDURAL

## 2024-05-29 MED ORDER — EPHEDRINE 5 MG/ML INJ
10.0000 mg | INTRAVENOUS | Status: DC | PRN
  Administered 2024-05-29: 10 mg via INTRAVENOUS
  Filled 2024-05-29: qty 5

## 2024-05-29 MED ORDER — LACTATED RINGERS IV SOLN
500.0000 mL | INTRAVENOUS | Status: DC | PRN
  Administered 2024-05-29: 1000 mL via INTRAVENOUS

## 2024-05-29 MED ORDER — OXYTOCIN-SODIUM CHLORIDE 30-0.9 UT/500ML-% IV SOLN
INTRAVENOUS | Status: AC
  Filled 2024-05-29: qty 500

## 2024-05-29 MED ORDER — CALCIUM CARBONATE ANTACID 500 MG PO CHEW: 2.0000 | CHEWABLE_TABLET | ORAL | Status: DC | PRN

## 2024-05-29 MED ORDER — MISOPROSTOL 200 MCG PO TABS
ORAL_TABLET | ORAL | Status: AC
  Filled 2024-05-29: qty 4

## 2024-05-29 MED ORDER — PHENYLEPHRINE 80 MCG/ML (10ML) SYRINGE FOR IV PUSH (FOR BLOOD PRESSURE SUPPORT): 80.0000 ug | PREFILLED_SYRINGE | INTRAVENOUS | Status: DC | PRN

## 2024-05-29 MED ORDER — BUPIVACAINE HCL (PF) 0.25 % IJ SOLN
INTRAMUSCULAR | Status: DC | PRN
  Administered 2024-05-29 (×2): 5 mL via EPIDURAL

## 2024-05-29 MED ORDER — ONDANSETRON HCL 4 MG/2ML IJ SOLN: 4.0000 mg | Freq: Four times a day (QID) | INTRAMUSCULAR | Status: DC | PRN

## 2024-05-29 MED ORDER — OXYTOCIN-SODIUM CHLORIDE 30-0.9 UT/500ML-% IV SOLN
2.5000 [IU]/h | INTRAVENOUS | Status: DC
  Administered 2024-05-30: 30 [IU] via INTRAVENOUS

## 2024-05-29 MED ORDER — AMMONIA AROMATIC IN INHA
RESPIRATORY_TRACT | Status: AC
Start: 2024-05-29 — End: 2024-05-30
  Filled 2024-05-29: qty 10

## 2024-05-29 MED ORDER — FENTANYL CITRATE (PF) 100 MCG/2ML IJ SOLN: 50.0000 ug | INTRAMUSCULAR | Status: DC | PRN

## 2024-05-29 MED ORDER — HYDROXYZINE HCL 25 MG PO TABS: 50.0000 mg | ORAL_TABLET | Freq: Four times a day (QID) | ORAL | Status: DC | PRN

## 2024-05-29 MED ORDER — LIDOCAINE-EPINEPHRINE (PF) 1.5 %-1:200000 IJ SOLN
INTRAMUSCULAR | Status: DC | PRN
  Administered 2024-05-29: 3 mL via PERINEURAL

## 2024-05-29 MED ORDER — PHENYLEPHRINE 80 MCG/ML (10ML) SYRINGE FOR IV PUSH (FOR BLOOD PRESSURE SUPPORT)
80.0000 ug | PREFILLED_SYRINGE | INTRAVENOUS | Status: DC | PRN
  Administered 2024-05-29 (×2): 80 ug via INTRAVENOUS
  Filled 2024-05-29: qty 10

## 2024-05-29 MED ORDER — LIDOCAINE HCL (PF) 1 % IJ SOLN
INTRAMUSCULAR | Status: AC
  Filled 2024-05-29: qty 30

## 2024-05-29 MED ORDER — DIPHENHYDRAMINE HCL 50 MG/ML IJ SOLN: 12.5000 mg | INTRAMUSCULAR | Status: DC | PRN

## 2024-05-29 MED ORDER — LIDOCAINE HCL (PF) 1 % IJ SOLN: 30.0000 mL | INTRAMUSCULAR | Status: DC | PRN

## 2024-05-29 MED ORDER — ACETAMINOPHEN 325 MG PO TABS
650.0000 mg | ORAL_TABLET | ORAL | Status: DC | PRN
Start: 2024-05-29 — End: 2024-05-30

## 2024-05-29 MED ORDER — OXYTOCIN 10 UNIT/ML IJ SOLN
INTRAMUSCULAR | Status: AC
  Filled 2024-05-29: qty 2

## 2024-05-29 MED ORDER — SOD CITRATE-CITRIC ACID 500-334 MG/5ML PO SOLN: 30.0000 mL | ORAL | Status: DC | PRN

## 2024-05-29 MED ORDER — LACTATED RINGERS IV SOLN: 500.0000 mL | Freq: Once | INTRAVENOUS | Status: DC

## 2024-05-29 NOTE — H&P (Signed)
 [facility] Labor & Delivery  History and Physical  ASSESSMENT AND PLAN   Kelly Eaton is a 28 y.o. G2P1001 at [redacted]w[redacted]d with EDD: 06/03/2024, by Last Menstrual Period admitted for labor management.  1. Admit to L&D  2. EFM: Continuous -- Category I/II 3. Admission labs  4. Pharmacologic pain relief if desired. 5. CBG q4h. Initiate Endotool if indicated 6. Anticipate NSVD 7. Dr. Leigh and anesthesia notified of admission    Fetal Status: - cephalic presentation by SVE and BSUS - EFW: 6.5 lbs by Leopold's - FHT currently category I/II  Labs/Immunizations: Prenatal labs and studies: ABO, Rh: O/Positive/-- (02/21 1559) Antibody: Negative (02/21 1559) Rubella: 6.07 (02/21 1559) Varicella: Reactive (02/21 1559)  RPR: Non Reactive (06/12 1040)  HBsAg: Negative (02/21 1559)  HepC: Non Reactive (02/21 1559) HIV: Non Reactive (06/12 1040)  GBS: Negative/-- (08/12 1632)   TDAP: received prenatally Flu: no RSV: no  Postpartum Plan: - Feeding: Breast Milk - Contraception: IUD - Prenatal Care Provider: [facility]   HPI   Chief Complaint: contractions  Kelly Eaton is a 28 y.o. G2P1001 at [redacted]w[redacted]d who presents for contractions that have become more painful and regular. She had minimal cervical change while in triage. There were a few isolated variable decelerations noted on her FHR monitoring. The decision was made to admit for labor, continuous monitoring, and pain management. Her pregnancy has been complicated by A1GDM and h/o prior cesarean. She plans a TOLAC and has been counseled and consented during her prenatal care.  Pregnancy Complications Patient Active Problem List   Diagnosis Date Noted   Labor and delivery, indication for care 05/29/2024   Gestational diabetes mellitus (GDM) affecting pregnancy 03/19/2024   Palpitations 01/25/2024   History of cesarean delivery 12/19/2023   Supervision of normal pregnancy 10/21/2023   Snoring 09/16/2023   Itchy scalp  07/27/2023   Anxiety 03/21/2023   Low back pain 12/20/2022   Ear fullness 12/20/2022   Encounter for completion of form with patient 07/21/2022   Moderate episode of recurrent major depressive disorder (HCC) 11/19/2017   Weight gain 10/07/2016   Depression 11/15/2015   Stress 11/15/2015   Hearing loss 11/15/2015    Review of Systems A twelve point review of systems was negative except as stated in HPI.   HISTORY   Medications Medications Prior to Admission  Medication Sig Dispense Refill Last Dose/Taking   Accu-Chek Softclix Lancets lancets 4 (four) times daily.   05/29/2024   Blood Glucose Monitoring Suppl (ACCU-CHEK GUIDE ME) w/Device KIT    05/29/2024   Blood Glucose Monitoring Suppl DEVI 1 each by Does not apply route in the morning, at noon, and at bedtime. May substitute to any manufacturer covered by patient's insurance. 1 each 0 05/29/2024   escitalopram  (LEXAPRO ) 20 MG tablet Take 10 mg by mouth daily.   05/29/2024   Glucose Blood (BLOOD GLUCOSE TEST STRIPS) STRP 1 each by In Vitro route QID. May substitute to any manufacturer covered by patient's insurance. 120 each 4 05/29/2024   Prenatal Vit-Fe Fumarate-FA (PRENATAL PO) Take by mouth daily.   05/29/2024    Allergies is allergic to oxycodone.   OB History OB History  Gravida Para Term Preterm AB Living  2 1 1  0 0 1  SAB IAB Ectopic Multiple Live Births  0 0 0 0 0    # Outcome Date GA Lbr Len/2nd Weight Sex Type Anes PTL Lv  2 Current  1 Term 03/18/22 [redacted]w[redacted]d  2722 g M CS-Unspec EPI       Name: Kelly Eaton    Past Medical History Past Medical History:  Diagnosis Date   Anxiety    Chest pain    Depression    Frequent headaches    cluster headaches.  had MRI.    Hx of migraines     Past Surgical History Past Surgical History:  Procedure Laterality Date   CESAREAN SECTION     MOUTH SURGERY  10/02/2011    Social History  reports that she has never smoked. She has never been exposed to tobacco smoke. She  has never used smokeless tobacco. She reports that she does not currently use alcohol. She reports that she does not use drugs.   Family History family history includes Alcohol abuse in her paternal grandfather; Early death in her paternal grandfather; Heart attack in her maternal grandfather and paternal grandfather; Heart disease in her maternal grandfather; Hypertension in her maternal grandfather and paternal grandfather.   PHYSICAL EXAM   Vitals:   05/29/24 1532 05/29/24 1535  BP:  128/65  Pulse:  80  Resp:  18  Temp:  98.1 F (36.7 C)  TempSrc:  Oral  Weight: 74.8 kg   Height: 4' 11 (1.499 m)     Constitutional: No acute distress, well appearing, and well nourished. Neurologic: She is alert and conversational.  Psychiatric: She has a normal mood and affect. +anxiety Musculoskeletal: Normal gait, grossly normal range of motion Cardiovascular: Normal rate.   Pulmonary/Chest: Normal work of breathing.  Gastrointestinal/Abdominal: Soft. Gravid. There is no tenderness.  Skin: Skin is warm and dry. No rash noted.  Genitourinary: Normal external female genitalia.  SVE:   Dilation: 3.5 Effacement (%): 70 Cervical Position: Middle Station: -2 Presentation: Vertex Exam by:: EMERSON Canny, CNM   NST Interpretation  Baseline: 120 bpm Variability: moderate Accelerations: present Decelerations: occasional variables Contractions: every 3-5 minutes Impression: reactive    Kelly Eaton Kelly Eaton Canny, CNM

## 2024-05-29 NOTE — Progress Notes (Signed)
 Labor Progress Note   ASSESSMENT/PLAN   Kelly Eaton 28 y.o.   G2P1001  at [redacted]w[redacted]d here for labor management.  FWB:  - Fetal well being assessed: Category 1        GBS: - GBS negative  LABOR: -  Latent labor labor, doing well. -Expectant management. Consider AROM or augmentation if indicated - Pain Management: Epidural - Anticipate SVD   Labor Progress Cervical Exam  Last 3 exams Dil Eff Sta Exam Time  5, BBOW 80 -2 2133  3.5 70 -2 1902  3.5 60 -2 1800    Principal Problem:   Labor and delivery, indication for care Active Problems:   Gestational diabetes mellitus (GDM) affecting pregnancy   SUBJECTIVE/OBJECTIVE   SUBJECTIVE:  Kelly Eaton is getting more comfortable with epidural  OBJECTIVE: Vital Signs: Patient Vitals for the past 12 hrs:  BP Temp Temp src Pulse Resp SpO2 Height Weight  05/29/24 2130 114/64 -- -- (!) 107 -- -- -- --  05/29/24 2129 -- -- -- -- -- 99 % -- --  05/29/24 2128 114/60 -- -- (!) 105 -- -- -- --  05/29/24 2125 117/72 -- -- 99 -- -- -- --  05/29/24 2124 -- -- -- -- -- 100 % -- --  05/29/24 2119 -- -- -- -- -- 99 % -- --  05/29/24 2114 -- -- -- -- -- 100 % -- --  05/29/24 2109 -- -- -- -- -- 99 % -- --  05/29/24 2104 -- -- -- -- -- 100 % -- --  05/29/24 2059 -- -- -- -- -- 98 % -- --  05/29/24 2055 132/80 -- -- 71 -- -- -- --  05/29/24 2003 -- -- -- -- -- -- 4' 10 (1.473 m) 74.8 kg  05/29/24 1535 128/65 98.1 F (36.7 C) Oral 80 18 -- -- --  05/29/24 1532 -- -- -- -- -- -- 4' 11 (1.499 m) 74.8 kg    Last SVE:  Dilation: 5 Effacement (%): 80 Cervical Position: Middle Station: -2 Presentation: Vertex Exam by:: Beverely Suen, CNM   FHR:   - Baseline: 120 - Variability: moderate - Accels: present - Decels: none Category 1  UTERINE ACTIVITY:  Contractions: q 4-5 min  [name] CHRISTELLA Canny, CNM

## 2024-05-29 NOTE — Anesthesia Preprocedure Evaluation (Signed)
 Anesthesia Evaluation  Patient identified by MRN, date of birth, ID band Patient awake    Reviewed: Allergy & Precautions, NPO status , Patient's Chart, lab work & pertinent test results  History of Anesthesia Complications Negative for: history of anesthetic complications  Airway Mallampati: II  TM Distance: >3 FB Neck ROM: full    Dental no notable dental hx.    Pulmonary neg pulmonary ROS   Pulmonary exam normal        Cardiovascular Exercise Tolerance: Good negative cardio ROS Normal cardiovascular exam     Neuro/Psych  Headaches PSYCHIATRIC DISORDERS Anxiety Depression       GI/Hepatic negative GI ROS,,,  Endo/Other  diabetes, Gestational    Renal/GU   negative genitourinary   Musculoskeletal   Abdominal   Peds  Hematology negative hematology ROS (+)   Anesthesia Other Findings Past Medical History: No date: Anxiety No date: Chest pain No date: Depression No date: Frequent headaches     Comment:  cluster headaches.  had MRI.  No date: Hx of migraines  Past Surgical History: No date: CESAREAN SECTION 10/02/2011: MOUTH SURGERY  BMI    Body Mass Index: 34.49 kg/m      Reproductive/Obstetrics (+) Pregnancy                              Anesthesia Physical Anesthesia Plan  ASA: 2  Anesthesia Plan: Epidural   Post-op Pain Management:    Induction:   PONV Risk Score and Plan:   Airway Management Planned: Natural Airway  Additional Equipment:   Intra-op Plan:   Post-operative Plan:   Informed Consent: I have reviewed the patients History and Physical, chart, labs and discussed the procedure including the risks, benefits and alternatives for the proposed anesthesia with the patient or authorized representative who has indicated his/her understanding and acceptance.     Dental Advisory Given  Plan Discussed with: Anesthesiologist  Anesthesia Plan Comments:  (Patient reports no bleeding problems and no anticoagulant use.   Patient consented for risks of anesthesia including but not limited to:  - adverse reactions to medications - risk of bleeding, infection and or nerve damage from epidural that could lead to paralysis - risk of headache or failed epidural - nerve damage due to positioning - that if epidural is used for C-section that there is a chance of epidural failure requiring spinal placement or conversion to GA - Damage to heart, brain, lungs, other parts of body or loss of life  Patient voiced understanding and assent.)        Anesthesia Quick Evaluation

## 2024-05-29 NOTE — Anesthesia Procedure Notes (Signed)
 Epidural Patient location during procedure: OB Start time: 05/29/2024 9:14 PM End time: 05/29/2024 9:15 PM  Staffing Anesthesiologist: Chesley Lendia CROME, MD Performed: anesthesiologist   Preanesthetic Checklist Completed: patient identified, IV checked, site marked, risks and benefits discussed, surgical consent, monitors and equipment checked, pre-op evaluation and timeout performed  Epidural Patient position: sitting Prep: ChloraPrep Patient monitoring: heart rate, continuous pulse ox and blood pressure Approach: midline Location: L3-L4 Injection technique: LOR saline  Needle:  Needle type: Tuohy  Needle gauge: 17 G Needle length: 9 cm and 9 Needle insertion depth: 6 cm Catheter type: closed end flexible Catheter size: 19 Gauge Catheter at skin depth: 11 cm Test dose: negative and 1.5% lidocaine  with Epi 1:200 K  Assessment Sensory level: T10 Events: blood not aspirated, injection not painful, no injection resistance, no paresthesia and negative IV test  Additional Notes 1 attempt Pt. Evaluated and documentation done after procedure finished. Patient identified. Risks/Benefits/Options discussed with patient including but not limited to bleeding, infection, nerve damage, paralysis, failed block, incomplete pain control, headache, blood pressure changes, nausea, vomiting, reactions to medication both or allergic, itching and postpartum back pain. Confirmed with bedside nurse the patient's most recent platelet count. Confirmed with patient that they are not currently taking any anticoagulation, have any bleeding history or any family history of bleeding disorders. Patient expressed understanding and wished to proceed. All questions were answered. Sterile technique was used throughout the entire procedure. Please see nursing notes for vital signs. Test dose was given through epidural catheter and negative prior to continuing to dose epidural or start infusion. Warning signs of high  block given to the patient including shortness of breath, tingling/numbness in hands, complete motor block, or any concerning symptoms with instructions to call for help. Patient was given instructions on fall risk and not to get out of bed. All questions and concerns addressed with instructions to call with any issues or inadequate analgesia.    Patient tolerated the insertion well without immediate complications. Reason for block:procedure for pain

## 2024-05-29 NOTE — Progress Notes (Signed)
 Pt presents to L/D triage with reported contractions that began at 0930 this morning and have begun to increase in frequency and intensity. Pt reports no bleeding or LOF and positive fetal movement. Monitors applied and assessing. Initial FHT 140. VSS.  SVE 3/70/-3.  CNM notified of patient's arrival.

## 2024-05-30 ENCOUNTER — Encounter
Admission: EM | Disposition: A | Discharge status: Home / Self Care | Payer: Self-pay | Source: Home / Self Care | Attending: Certified Nurse Midwife

## 2024-05-30 ENCOUNTER — Inpatient Hospital Stay

## 2024-05-30 ENCOUNTER — Encounter: Payer: Self-pay | Admitting: Obstetrics

## 2024-05-30 LAB — CREATININE, SERUM
Creatinine, Ser: 0.67 mg/dL (ref 0.44–1.00)
GFR, Estimated: >60 mL/min (ref >60)

## 2024-05-30 LAB — CBC
HCT: 34.1 % — ABNORMAL LOW (ref 36.0–46.0)
Hemoglobin: 11.5 g/dL — ABNORMAL LOW (ref 12.0–15.0)
MCH: 32.2 pg (ref 26.0–34.0)
MCHC: 33.7 g/dL (ref 30.0–36.0)
MCV: 95.5 fL (ref 80.0–100.0)
Platelets: 211 K/uL (ref 150–400)
RBC: 3.57 MIL/uL — ABNORMAL LOW (ref 3.87–5.11)
RDW: 13.2 % (ref 11.5–15.5)
WBC: 22 K/uL — ABNORMAL HIGH (ref 4.0–10.5)
nRBC: 0 % (ref 0.0–0.2)

## 2024-05-30 LAB — GLUCOSE, CAPILLARY: Glucose-Capillary: 142 mg/dL — ABNORMAL HIGH (ref 70–99)

## 2024-05-30 LAB — RPR: RPR Ser Ql: NONREACTIVE

## 2024-05-30 LAB — ABO/RH: ABO/RH(D): O POS

## 2024-05-30 SURGERY — Surgical Case
Anesthesia: General

## 2024-05-30 MED ORDER — PROPOFOL 10 MG/ML IV BOLUS
INTRAVENOUS | Status: DC | PRN
  Administered 2024-05-30: 200 mg via INTRAVENOUS
  Administered 2024-05-30: 50 mg via INTRAVENOUS

## 2024-05-30 MED ORDER — FENTANYL CITRATE (PF) 100 MCG/2ML IJ SOLN
INTRAMUSCULAR | Status: AC
Start: 2024-05-30 — End: 2024-05-30
  Filled 2024-05-30: qty 2

## 2024-05-30 MED ORDER — KETOROLAC TROMETHAMINE 30 MG/ML IJ SOLN: 30.0000 mg | Freq: Four times a day (QID) | INTRAMUSCULAR | Status: AC | PRN

## 2024-05-30 MED ORDER — ONDANSETRON HCL 4 MG/2ML IJ SOLN
INTRAMUSCULAR | Status: DC | PRN
  Administered 2024-05-30: 4 mg via INTRAVENOUS

## 2024-05-30 MED ORDER — BUPIVACAINE HCL (PF) 0.25 % IJ SOLN
INTRAMUSCULAR | Status: AC
  Filled 2024-05-30: qty 60

## 2024-05-30 MED ORDER — TRANEXAMIC ACID-NACL 1000-0.7 MG/100ML-% IV SOLN
INTRAVENOUS | Status: AC
  Filled 2024-05-30: qty 100

## 2024-05-30 MED ORDER — KETOROLAC TROMETHAMINE 30 MG/ML IJ SOLN
INTRAMUSCULAR | Status: DC | PRN
  Administered 2024-05-30: 30 mg via INTRAVENOUS

## 2024-05-30 MED ORDER — ZOLPIDEM TARTRATE 5 MG PO TABS: 5.0000 mg | ORAL_TABLET | Freq: Every evening | ORAL | Status: DC | PRN

## 2024-05-30 MED ORDER — ACETAMINOPHEN 500 MG PO TABS
1000.0000 mg | ORAL_TABLET | Freq: Four times a day (QID) | ORAL | Status: DC
  Administered 2024-05-30 – 2024-06-01 (×8): 1000 mg via ORAL
  Filled 2024-05-30 (×9): qty 2

## 2024-05-30 MED ORDER — MIDAZOLAM HCL 2 MG/2ML IJ SOLN
INTRAMUSCULAR | Status: AC
  Filled 2024-05-30: qty 2

## 2024-05-30 MED ORDER — DIBUCAINE (PERIANAL) 1 % EX OINT
1.0000 | TOPICAL_OINTMENT | CUTANEOUS | Status: DC | PRN
Start: 2024-05-30 — End: 2024-06-01

## 2024-05-30 MED ORDER — MIDAZOLAM HCL 2 MG/2ML IJ SOLN
INTRAMUSCULAR | Status: DC | PRN
  Administered 2024-05-30: 2 mg via INTRAVENOUS

## 2024-05-30 MED ORDER — MENTHOL 3 MG MT LOZG: 1.0000 | LOZENGE | OROMUCOSAL | Status: DC | PRN

## 2024-05-30 MED ORDER — COCONUT OIL OIL: 1.0000 | TOPICAL_OIL | Status: DC | PRN

## 2024-05-30 MED ORDER — SODIUM CHLORIDE 0.9% FLUSH: 3.0000 mL | INTRAVENOUS | Status: DC | PRN

## 2024-05-30 MED ORDER — NALOXONE HCL 4 MG/10ML IJ SOLN: 1.0000 ug/kg/h | INTRAVENOUS | Status: DC | PRN

## 2024-05-30 MED ORDER — WITCH HAZEL-GLYCERIN EX PADS: 1.0000 | MEDICATED_PAD | CUTANEOUS | Status: DC | PRN

## 2024-05-30 MED ORDER — PROPOFOL 10 MG/ML IV BOLUS
INTRAVENOUS | Status: AC
  Filled 2024-05-30: qty 20

## 2024-05-30 MED ORDER — ONDANSETRON HCL 4 MG/2ML IJ SOLN
4.0000 mg | Freq: Three times a day (TID) | INTRAMUSCULAR | Status: DC | PRN
  Administered 2024-05-30: 4 mg via INTRAVENOUS
  Filled 2024-05-30: qty 2

## 2024-05-30 MED ORDER — DIPHENHYDRAMINE HCL 25 MG PO CAPS: 25.0000 mg | ORAL_CAPSULE | Freq: Four times a day (QID) | ORAL | Status: DC | PRN

## 2024-05-30 MED ORDER — MEPERIDINE HCL 25 MG/ML IJ SOLN: 6.2500 mg | INTRAMUSCULAR | Status: DC | PRN

## 2024-05-30 MED ORDER — SODIUM CHLORIDE 0.9 % IV SOLN
12.5000 mg | Freq: Once | INTRAVENOUS | Status: DC
  Filled 2024-05-30: qty 0.5

## 2024-05-30 MED ORDER — ENOXAPARIN SODIUM 40 MG/0.4ML IJ SOSY
40.0000 mg | PREFILLED_SYRINGE | INTRAMUSCULAR | Status: DC
  Administered 2024-05-31 – 2024-06-01 (×2): 40 mg via SUBCUTANEOUS
  Filled 2024-05-30 (×4): qty 0.4

## 2024-05-30 MED ORDER — METHYLENE BLUE (ANTIDOTE) 1 % IV SOLN
INTRAVENOUS | Status: AC
  Filled 2024-05-30: qty 10

## 2024-05-30 MED ORDER — SODIUM CHLORIDE 0.9 % IV SOLN
500.0000 mg | Freq: Once | INTRAVENOUS | Status: AC
  Administered 2024-05-30: 500 mg via INTRAVENOUS
  Filled 2024-05-30: qty 5

## 2024-05-30 MED ORDER — SODIUM CHLORIDE 0.9 % IV SOLN: 500.0000 mg | INTRAVENOUS | Status: DC

## 2024-05-30 MED ORDER — DEXAMETHASONE SODIUM PHOSPHATE 10 MG/ML IJ SOLN
INTRAMUSCULAR | Status: DC | PRN
  Administered 2024-05-30: 10 mg via INTRAVENOUS

## 2024-05-30 MED ORDER — FENTANYL CITRATE (PF) 100 MCG/2ML IJ SOLN
INTRAMUSCULAR | Status: AC
  Filled 2024-05-30: qty 2

## 2024-05-30 MED ORDER — OXYTOCIN 10 UNIT/ML IJ SOLN
INTRAMUSCULAR | Status: DC | PRN
  Administered 2024-05-30: 10 [IU] via INTRAMUSCULAR

## 2024-05-30 MED ORDER — DIPHENHYDRAMINE HCL 50 MG/ML IJ SOLN: 12.5000 mg | INTRAMUSCULAR | Status: DC | PRN

## 2024-05-30 MED ORDER — OXYTOCIN-SODIUM CHLORIDE 30-0.9 UT/500ML-% IV SOLN: 2.5000 [IU]/h | INTRAVENOUS | Status: AC

## 2024-05-30 MED ORDER — IBUPROFEN 600 MG PO TABS
600.0000 mg | ORAL_TABLET | Freq: Four times a day (QID) | ORAL | Status: DC
  Administered 2024-05-31 – 2024-06-01 (×6): 600 mg via ORAL
  Filled 2024-05-30 (×6): qty 1

## 2024-05-30 MED ORDER — SCOPOLAMINE 1 MG/3DAYS TD PT72: 1.0000 | MEDICATED_PATCH | Freq: Once | TRANSDERMAL | Status: DC

## 2024-05-30 MED ORDER — SUCCINYLCHOLINE CHLORIDE 200 MG/10ML IV SOSY
PREFILLED_SYRINGE | INTRAVENOUS | Status: DC | PRN
  Administered 2024-05-30: 80 mg via INTRAVENOUS

## 2024-05-30 MED ORDER — PHENYLEPHRINE HCL-NACL 20-0.9 MG/250ML-% IV SOLN
INTRAVENOUS | Status: AC
  Filled 2024-05-30: qty 250

## 2024-05-30 MED ORDER — NALOXONE HCL 0.4 MG/ML IJ SOLN
0.4000 mg | INTRAMUSCULAR | Status: DC | PRN
Start: 2024-05-30 — End: 2024-06-01

## 2024-05-30 MED ORDER — FENTANYL CITRATE (PF) 100 MCG/2ML IJ SOLN
INTRAMUSCULAR | Status: DC | PRN
  Administered 2024-05-30 (×3): 50 ug via INTRAVENOUS
  Administered 2024-05-30: 25 ug via INTRAVENOUS
  Administered 2024-05-30: 50 ug via INTRAVENOUS
  Administered 2024-05-30: 25 ug via INTRAVENOUS

## 2024-05-30 MED ORDER — DIPHENHYDRAMINE HCL 25 MG PO CAPS: 25.0000 mg | ORAL_CAPSULE | ORAL | Status: DC | PRN

## 2024-05-30 MED ORDER — TRANEXAMIC ACID-NACL 1000-0.7 MG/100ML-% IV SOLN
INTRAVENOUS | Status: DC | PRN
  Administered 2024-05-30: 1000 mg via INTRAVENOUS

## 2024-05-30 MED ORDER — PHENYLEPHRINE HCL-NACL 20-0.9 MG/250ML-% IV SOLN
INTRAVENOUS | Status: DC | PRN
Start: 2024-05-30 — End: 2024-05-30
  Administered 2024-05-30: 30 ug/min via INTRAVENOUS

## 2024-05-30 MED ORDER — BUPIVACAINE HCL 0.25 % IJ SOLN
INTRAMUSCULAR | Status: DC | PRN
  Administered 2024-05-30: 50 mL

## 2024-05-30 MED ORDER — LACTATED RINGERS IV BOLUS
1000.0000 mL | Freq: Once | INTRAVENOUS | Status: AC
  Administered 2024-05-30: 1000 mL via INTRAVENOUS

## 2024-05-30 MED ORDER — TERBUTALINE SULFATE 1 MG/ML IJ SOLN
INTRAMUSCULAR | Status: AC
  Administered 2024-05-30: 1 mg
  Filled 2024-05-30: qty 1

## 2024-05-30 MED ORDER — BUPIVACAINE HCL (PF) 0.5 % IJ SOLN
INTRAMUSCULAR | Status: AC
  Filled 2024-05-30: qty 20

## 2024-05-30 MED ORDER — ARTIFICIAL TEARS OPHTHALMIC OINT
TOPICAL_OINTMENT | OPHTHALMIC | Status: AC
  Filled 2024-05-30: qty 3.5

## 2024-05-30 MED ORDER — SOD CITRATE-CITRIC ACID 500-334 MG/5ML PO SOLN: 30.0000 mL | ORAL | Status: DC

## 2024-05-30 MED ORDER — PHENYLEPHRINE 80 MCG/ML (10ML) SYRINGE FOR IV PUSH (FOR BLOOD PRESSURE SUPPORT)
PREFILLED_SYRINGE | INTRAVENOUS | Status: DC | PRN
  Administered 2024-05-30 (×4): 80 ug via INTRAVENOUS

## 2024-05-30 MED ORDER — PRENATAL MULTIVITAMIN CH
1.0000 | ORAL_TABLET | Freq: Every day | ORAL | Status: DC
  Administered 2024-05-30 – 2024-06-01 (×3): 1 via ORAL
  Filled 2024-05-30 (×4): qty 1

## 2024-05-30 MED ORDER — MORPHINE SULFATE (PF) 0.5 MG/ML IJ SOLN
INTRAMUSCULAR | Status: AC
  Filled 2024-05-30: qty 10

## 2024-05-30 MED ORDER — SIMETHICONE 80 MG PO CHEW: 80.0000 mg | CHEWABLE_TABLET | ORAL | Status: DC | PRN

## 2024-05-30 MED ORDER — TRAMADOL HCL 50 MG PO TABS: 50.0000 mg | ORAL_TABLET | Freq: Four times a day (QID) | ORAL | Status: DC | PRN

## 2024-05-30 MED ORDER — SENNOSIDES-DOCUSATE SODIUM 8.6-50 MG PO TABS
2.0000 | ORAL_TABLET | Freq: Every day | ORAL | Status: DC
  Administered 2024-05-31 – 2024-06-01 (×2): 2 via ORAL
  Filled 2024-05-30 (×2): qty 2

## 2024-05-30 MED ORDER — KETOROLAC TROMETHAMINE 30 MG/ML IJ SOLN
30.0000 mg | Freq: Four times a day (QID) | INTRAMUSCULAR | Status: AC
  Administered 2024-05-30 (×3): 30 mg via INTRAVENOUS
  Filled 2024-05-30 (×3): qty 1

## 2024-05-30 MED ORDER — GABAPENTIN 300 MG PO CAPS
300.0000 mg | ORAL_CAPSULE | Freq: Every day | ORAL | Status: DC
  Administered 2024-05-30 – 2024-05-31 (×2): 300 mg via ORAL
  Filled 2024-05-30 (×2): qty 1

## 2024-05-30 MED ORDER — CEFAZOLIN SODIUM-DEXTROSE 2-4 GM/100ML-% IV SOLN
2.0000 g | INTRAVENOUS | Status: AC
  Administered 2024-05-30: 2 mg via INTRAVENOUS

## 2024-05-30 MED ORDER — SIMETHICONE 80 MG PO CHEW
80.0000 mg | CHEWABLE_TABLET | Freq: Three times a day (TID) | ORAL | Status: DC
  Administered 2024-05-30 – 2024-06-01 (×6): 80 mg via ORAL
  Filled 2024-05-30 (×6): qty 1

## 2024-05-30 MED ORDER — MORPHINE SULFATE 15 MG PO TABS
15.0000 mg | ORAL_TABLET | ORAL | Status: DC | PRN
  Administered 2024-05-30: 15 mg via ORAL
  Filled 2024-05-30 (×2): qty 1

## 2024-05-30 MED ORDER — SODIUM CHLORIDE 0.9 % IV SOLN
INTRAVENOUS | Status: AC
  Filled 2024-05-30: qty 5

## 2024-05-30 SURGICAL SUPPLY — 30 items
CHLORAPREP W/TINT 26 (MISCELLANEOUS) ×1 IMPLANT
DRESSING TELFA 8X3 (GAUZE/BANDAGES/DRESSINGS) IMPLANT
DRSG TELFA 3X8 NADH STRL (GAUZE/BANDAGES/DRESSINGS) ×1 IMPLANT
ELECT CAUTERY BLADE 6.4 (BLADE) ×1 IMPLANT
ELECTRODE REM PT RTRN 9FT ADLT (ELECTROSURGICAL) ×1 IMPLANT
GAUZE SPONGE 4X4 12PLY STRL (GAUZE/BANDAGES/DRESSINGS) ×1 IMPLANT
GLOVE SURG SYN 8.0 PF PI (GLOVE) ×1 IMPLANT
GOWN STRL REUS W/ TWL LRG LVL3 (GOWN DISPOSABLE) ×2 IMPLANT
GOWN STRL REUS W/ TWL XL LVL3 (GOWN DISPOSABLE) ×1 IMPLANT
MANIFOLD NEPTUNE II (INSTRUMENTS) ×1 IMPLANT
MAT PREVALON FULL STRYKER (MISCELLANEOUS) ×1 IMPLANT
NDL HYPO 22X1.5 SAFETY MO (MISCELLANEOUS) ×1 IMPLANT
NEEDLE HYPO 22X1.5 SAFETY MO (MISCELLANEOUS) ×1 IMPLANT
NS IRRIG 1000ML POUR BTL (IV SOLUTION) ×1 IMPLANT
PACK C SECTION AR (MISCELLANEOUS) ×1 IMPLANT
PAD OB MATERNITY 11 LF (PERSONAL CARE ITEMS) ×1 IMPLANT
PAD PREP OB/GYN DISP 24X41 (PERSONAL CARE ITEMS) ×1 IMPLANT
SCRUB CHG 4% DYNA-HEX 4OZ (MISCELLANEOUS) ×1 IMPLANT
SPONGE GAUZE 4X4 FOR O.R. (GAUZE/BANDAGES/DRESSINGS) IMPLANT
STAPLER INSORB 30 2030 C-SECTI (MISCELLANEOUS) IMPLANT
STRAP SAFETY 5IN WIDE (MISCELLANEOUS) ×1 IMPLANT
SUCT VACUUM KIWI BELL (SUCTIONS) IMPLANT
SUT CHROMIC 1 CTX 36 (SUTURE) ×3 IMPLANT
SUT PLAIN GUT 0 (SUTURE) ×2 IMPLANT
SUT VIC AB 0 CT1 36 (SUTURE) ×2 IMPLANT
SYR 30ML LL (SYRINGE) ×2 IMPLANT
TAPE PAPER 2X10 WHT MICROPORE (GAUZE/BANDAGES/DRESSINGS) IMPLANT
TAPE PAPER 3X10 WHT MICROPORE (GAUZE/BANDAGES/DRESSINGS) IMPLANT
TRAP FLUID SMOKE EVACUATOR (MISCELLANEOUS) ×1 IMPLANT
WATER STERILE IRR 500ML POUR (IV SOLUTION) ×1 IMPLANT

## 2024-05-30 NOTE — Anesthesia Procedure Notes (Signed)
 Procedure Name: Intubation Date/Time: 05/30/2024 3:08 AM  Performed by: Erie Jyl MATSU, CRNAPre-anesthesia Checklist: Patient identified, Patient being monitored, Timeout performed, Emergency Drugs available and Suction available Patient Re-evaluated:Patient Re-evaluated prior to induction Oxygen Delivery Method: Circle system utilized Preoxygenation: Pre-oxygenation with 100% oxygen Induction Type: IV induction and Rapid sequence Laryngoscope Size: McGraph and 3 Grade View: Grade I Tube type: Oral Tube size: 6.5 mm Number of attempts: 1 Airway Equipment and Method: Stylet Placement Confirmation: ETT inserted through vocal cords under direct vision, positive ETCO2 and breath sounds checked- equal and bilateral Secured at: 20 cm Tube secured with: Tape Dental Injury: Teeth and Oropharynx as per pre-operative assessment

## 2024-05-30 NOTE — Lactation Note (Signed)
 This note was copied from a baby's chart. Lactation Consultation Note  Patient Name: Kelly Eaton Unijb'd Date: 05/30/2024 Age:29 hours Reason for consult: Follow-up assessment;Difficult latch;RN request;Breastfeeding assistance  LC to MOB bedside per RN request. Infant sleeping with visitor and MOB utilizing DEBP upon LC room entry.  Maternal Data  MOB request 20mm shield to attempt latching infant at breast when pumping cycle has completed. MOB reports no breast pain or tenderness at this time, and is currently pumping transitional milk with the right breast currently producing larger quantity.   Feeding Mother's Current Feeding Choice: Breast Milk LC latch attempt unsuccessful as infant too drowsy to wake. Reassured MOB/FOB that infant behavior during first 24 hours of life is within typical range, with expectations of more wakeful hunger cues to be expected following first DOL. MOB pumped 13mL colostrum, LC transfer to bottle with slow flow nipple for MOB to bottle feed.      Lactation Tools Discussed/Used Tools: Nipple Shields (20mm shield admin per MOB request) Nipple shield size: 20 Breast pump type: Double-Electric Breast Pump Pump Education: Setup, frequency, and cleaning;Milk Storage Reason for Pumping: Infant latch difficulty Pumping frequency: Every 3 hours or as per supplement occurence Pumped volume: 13 mL  To attempt feed at breast, shield primed using small amount of maternal colostrum and on infant mouth to encourage association of feeding at breast and sustain latch. With infant sleeping, latch not elicited with MOB bottle feeding expressed colostrum while LC demo cleaning breast pump parts. Advised that colostrum collectors are not a necessity with observed amount of maternal colostrum readily available and optional for use during next scheduled pumping session. Interventions Interventions: Expressed milk;Pace feeding;LPT handout/interventions;CDC milk storage  guidelines;CDC Guidelines for Breast Pump Cleaning;Infant Driven Feeding Algorithm education Discussed feeding frequency and infant stomach capacity on first 24 hours of life as well as normative infant behavior and WTE for monitoring diaper output, early and late stage hunger cues, prioritization of rest for dyad, and typical onset of infant cluster feeding to influence milk maturation and regulate maternal supply.   Discharge Discharge Education: Engorgement and breast care;Warning signs for feeding baby;Outpatient recommendation  Consult Status Consult Status: Follow-up Date: 05/31/24 Follow-up type: In-patient    Donald JONETTA Minerva 05/30/2024, 3:31 PM

## 2024-05-30 NOTE — Anesthesia Post-op Follow-up Note (Signed)
  Anesthesia Pain Follow-up Note  Patient: Kelly Eaton  Day #: 1  Date of Follow-up: 05/30/2024 Time: 8:49 AM  Last Vitals:  Vitals:   05/30/24 0705 05/30/24 0825  BP: (!) 124/58 110/67  Pulse: 78 66  Resp: 17 18  Temp:  36.9 C  SpO2: 96%     Level of Consciousness: alert  Pain: none   Side Effects:None  Catheter Site Exam:clean, dry, no drainage  Anti-Coag Meds (From admission, onward)    Start     Dose/Rate Route Frequency Ordered Stop   05/31/24 0800  enoxaparin  (LOVENOX ) injection 40 mg        40 mg Subcutaneous Every 24 hours 05/30/24 0554          Plan: D/C from anesthesia care at surgeon's request  [facility]

## 2024-05-30 NOTE — Discharge Summary (Signed)
 Postpartum Discharge Summary  Date of Service updated: 06/01/2024     Patient Name: Kelly Eaton DOB: 24-Jul-1996 MRN: [id number]  Date of admission: 05/29/2024 Delivery date:05/30/2024 Delivering MD: Debby Dinsmore Date of discharge: 06/01/2024  Admitting diagnosis: Labor and delivery, indication for care [O75.9] Intrauterine pregnancy: [redacted]w[redacted]d     Secondary diagnosis:  Principal Problem:   Labor and delivery, indication for care Active Problems:   Gestational diabetes mellitus (GDM) affecting pregnancy   Postpartum care following cesarean delivery   Cesarean delivery delivered   Encounter for care or examination of lactating mother  Additional problems: Possible uterine rupture    Discharge diagnosis: Term Pregnancy Delivered and GDM A1                                              Post partum procedures:none Augmentation: AROM Complications: possible uterine rupture  Hospital course: Onset of Labor With Unplanned C/S   28 y.o. yo G2P2002 at [redacted]w[redacted]d was admitted in Latent Labor on 05/29/2024. Patient had a labor course significant for fetal bradycardia in the second stage. The patient went for cesarean section due to Non-Reassuring FHR. Delivery details as follows: Membrane Rupture Time/Date: 12:47 AM,05/30/2024  Delivery Method:C-Section, Vacuum Assisted Operative Delivery:intraoperative Details of operation can be found in separate operative note.   Patient had a postpartum course complicated by NA.  She is tolerating regular diet, pain is controlled with PO medication, ambulating and voiding without difficulty.  She reports breastfeeding is going well. She states her questions about the delivery have been answered.   Patient is discharged home in stable condition 06/01/24.   Newborn Data: Birth date:05/30/2024 Birth time:3:11 AM Gender:Female Living status:Living Apgars:7 ,8  Weight:2700 g  Magnesium  Sulfate received: No BMZ received:  No Rhophylac:N/A MMR:N/A T-DaP:Given prenatally Flu: No RSV Vaccine received: No Transfusion:No Immunizations administered: Immunization History  Administered Date(s) Administered   DTaP 03/11/1995, 05/09/1995, 07/08/1995, 06/05/1996, 01/04/2000   HPV Bivalent 09/11/2006, 12/13/2006, 01/09/2013   Hepatitis A, Ped/Adol-2 Dose 05/20/2006, 12/13/2006   Hepatitis B, PED/ADOLESCENT 01/19/1995, 03/11/1995, 07/08/1995   IPV 03/11/1995, 05/09/1995, 07/08/1995, 01/04/2000   Influenza, Seasonal, Injecte, Preservative Fre 07/26/2023   Influenza,inj,Quad PF,6+ Mos 09/04/2017, 10/31/2021   Influenza-Unspecified 08/02/2015   MMR 06/05/1996, 01/04/2000   Meningococcal polysaccharide vaccine (MPSV4) 05/19/2009, 01/09/2013   Moderna Sars-Covid-2 Vaccination 11/25/2019, 11/27/2019, 12/23/2019, 02/16/2021   Tdap 05/20/2006, 03/12/2024   Varicella 06/05/1996, 05/18/2008    Physical exam  Vitals:   05/31/24 1300 05/31/24 1553 06/01/24 0054 06/01/24 0747  BP: 115/71 110/65 122/77 116/73  Pulse: 73 85 70 90  Resp:  18 20 18   Temp: 98.4 F (36.9 C) 98.7 F (37.1 C) 97.7 F (36.5 C) 98.7 F (37.1 C)  TempSrc: Oral Oral Oral Oral  SpO2: 95% 99% 100% 98%  Weight:      Height:       General: alert, cooperative, and no distress Lochia: appropriate Uterine Fundus: firm Incision: Dressing is clean, dry, and intact DVT Evaluation: No evidence of DVT seen on physical exam. Labs: Lab Results  Component Value Date   WBC 12.3 (H) 05/31/2024   HGB 9.2 (L) 05/31/2024   HCT 27.2 (L) 05/31/2024   MCV 97.5 05/31/2024   PLT 159 05/31/2024      Latest Ref Rng & Units 05/30/2024    7:47 AM  CMP  Creatinine 0.44 - 1.00 mg/dL  0.67    Edinburgh Score:    05/30/2024    8:13 PM  Edinburgh Postnatal Depression Scale Screening Tool  I have been able to laugh and see the funny side of things. 0  I have looked forward with enjoyment to things. 0  I have blamed myself unnecessarily when things went  wrong. 2  I have been anxious or worried for no good reason. 2  I have felt scared or panicky for no good reason. 1  Things have been getting on top of me. 2  I have been so unhappy that I have had difficulty sleeping. 0  I have felt sad or miserable. 1  I have been so unhappy that I have been crying. 1  The thought of harming myself has occurred to me. 0  Edinburgh Postnatal Depression Scale Total 9      After visit meds:  Allergies as of 06/01/2024       Reactions   Oxycodone  Other (See Comments)        Medication List     STOP taking these medications    Accu-Chek Guide Me w/Device Kit   Accu-Chek Softclix Lancets lancets   Blood Glucose Monitoring Suppl Devi   BLOOD GLUCOSE TEST STRIPS Strp       TAKE these medications    escitalopram  20 MG tablet Commonly known as: LEXAPRO  Take 10 mg by mouth daily.   oxyCODONE  5 MG immediate release tablet Commonly known as: Roxicodone  Take 1 tablet (5 mg total) by mouth every 6 (six) hours as needed for up to 5 days for severe pain (pain score 7-10).   PRENATAL PO Take by mouth daily.               Discharge Care Instructions  (From admission, onward)           Start     Ordered   06/01/24 0000  Discharge wound care:       Comments: Keep incision dry, clean.   06/01/24 0944             Discharge home in stable condition Infant Feeding: Breast Infant Disposition:home with mother Discharge instruction: per After Visit Summary and Postpartum booklet. Activity: Advance as tolerated. Pelvic rest for 6 weeks.  Diet: carb modified diet Anticipated Birth Control: Mirena IUD Postpartum Appointment:2 weeks and 6 weeks Additional Postpartum F/U: PRN Future Appointments: Future Appointments  Date Time Provider Department Center  06/02/2024  3:15 PM Slaughterbeck, Damien, CNM [facility] None  07/27/2024  9:00 AM [name], MD [id number]   Follow up Visit:  Follow-up Information      Schermerhorn, Debby PARAS, MD. Schedule an appointment as soon as possible for a visit.   Specialty: Obstetrics and Gynecology Why: 2 weeks incision check Contact information: [address] [place] [address] [phone]                  06/01/2024 Slater Rains, CNM

## 2024-05-30 NOTE — Lactation Note (Signed)
 This note was copied from a baby's chart. Lactation Consultation Note  Patient Name: Kelly Eaton Unijb'd Date: 05/30/2024 Age:29 hours Reason for consult: Initial assessment;Breastfeeding assistance  LC to MOB bedside to provide initial consult and infant feeding evaluation  Maternal Data Has patient been taught Hand Expression?: Yes Does the patient have breastfeeding experience prior to this delivery?: Yes How long did the patient breastfeed?: 10 months MOB has hx of breastfeeding for 10 months with older sibling (79 years old). She has no pain or discomfort to report at this time.  Feeding Mother's Current Feeding Choice: Breast Milk and Donor Milk Infant latch was not sustained within 5 minute attempt at latching directly at breast. MOB set up with DEBP and sized at 18mm shield with LC supervision to ensure adequate fit for breast stimulation. Glove left at bedside in the event expressed colostrum could be finger fed to infant as quantity expected at [facility] may not be measurable for collection.  Lactation Tools Discussed/Used Tools: Pump;Bottle MOB pumping for 15 minutes with hospital grade pump every 2-3 hours or to supplement feedings otherwise occurring away from breast. Discussed feeding frequency and infant stomach capacity on first 24 hours of life as well as normative infant behavior and WTE for monitoring diaper output, early and late stage hunger cues, prioritization of rest for dyad, and typical onset of infant cluster feeding to influence milk maturation and regulate maternal supply.  Interventions Interventions: Breast feeding basics reviewed;Assisted with latch;Breast massage;Hand express;Breast compression;Adjust position;DEBP;Education;Infant Driven Feeding Algorithm education;CDC milk storage guidelines;CDC Guidelines for Breast Pump Cleaning DEBP assembly by LC provided as well as milk storage guidelines and cleaning recommendations. MOB sized at 18mm flange fit provided  with DEBP kit, with LC anticipatory guidance given regarding breast changes and hormone fluctuation that may impact fit over time and with various vacuum settings.Discussed limiting pumping sessions to 15 maximum duration interval when supplmentaton of bottle feeding occurs, and to pump from non-nursing breast or following feed for 2-3 minutes in the event any discomfort or fullness persists. Emphasized supply and demand process of maternal milk maturation and production, and advised MOB to remain aware of signs of worsening engorgement.  Discharge Discharge Education: Engorgement and breast care;Warning signs for feeding baby;Outpatient recommendation Will return for feed assessment PRN upon MOB request. LC name and phone number updated in pt room.  Consult Status Consult Status: Follow-up Date: 05/30/24 Follow-up type: In-patient    Donald JONETTA Minerva 05/30/2024, 1:34 PM

## 2024-05-30 NOTE — Op Note (Unsigned)
 NAME: Kelly Eaton, Kelly Eaton MEDICAL RECORD NO: [id number] ACCOUNT NO: [id number] DATE OF BIRTH: 05-15-1996 FACILITY: [facility] LOCATION: [facility] PHYSICIAN: Debby DOROTHA Dinsmore, MD  Operative Report   PREOPERATIVE DIAGNOSES: 1.  39 plus 3 weeks estimated gestational age. 2.  Trial of labor after cesarean section. 3.  Fetal bradycardia with fetal intolerance to labor.  POSTOPERATIVE DIAGNOSES: 1.  39 plus 3 weeks estimated gestational age. 2.  Trial of labor after cesarean section. 3.  Fetal bradycardia with fetal intolerance to labor. 4.  Vigorous female delivered. 5.  Extensive uterine/cervical laceration and extension.  PROCEDURES: 1.  Low transverse cesarean section. 2.  Extensive repair of uterine/cervical laceration. 3.  Retrograde filling of the bladder.  SURGEON:  Debby DOROTHA Dinsmore, MD  FIRST ASSISTANT:  Lindle Canny, certified nurse midwife.  ANESTHESIA:  General endotracheal anesthesia  INDICATIONS:  This is a 28 year old gravida 2, para 1 patient with a planned trial of labor after cesarean section.  The patient within 20 minutes of pushing developed fetal bradycardic episodes which did not respond to conservative therapy.  The patient  was promptly taken back to the operating room where fetal heart tones were in the 90s during a quick Doppler check.  Therefore, the patient underwent general endotracheal anesthesia.  DESCRIPTION OF PROCEDURE:  After adequate prep and drape of the patient, the patient underwent general endotracheal anesthesia and a Pfannenstiel incision was made.  Sharp dissection was used to identify the fascia.  The fascia was opened in the midline  and opened in a transverse fashion.  Superior aspect of the fascia was grasped with Kocher clamps and the recti muscles were dissected free.  Entry into the peritoneal cavity was accomplished sharply.  Vesicouterine peritoneal fold was identified and a  direct low transverse uterine incision was made.  Once  gaining entrance into the endometrial cavity, clear fluid resulted.  The incision was extended with blunt transverse traction.  Extremely wedged fetal head was brought to the incision and elevated to  the uterus and vacuum was applied to the occiput and one gentle pull allowed for delivery of the head.  The vacuum was removed and the shoulders and body were delivered.  The cord was doubly clamped and infant with spontaneous cry was passed to nursery  staff, Apgar score 7 and 8 female infant.  Placenta was manually delivered and the uterus was exteriorized.  There was an extensive uterine laceration that extended deep into the cervical area.  Meticulous suturing reapproximated the cervical laceration in  the lower uterine segment to the superior uterine hysterotomy.  Halfway through the procedure, retrograde filling of the bladder was performed with 240 mL normal saline with methylene blue .  There was no spillage of dye into the patient's abdomen and  the bladder was distended as expected.  The bladder was then re-drained.  The rest of the uterine repair proceeded.  Good hemostasis was noted.  Fallopian tubes and ovaries appeared normal.  The posterior cul-de-sac was irrigated and suctioned and the  uterus was placed back into the abdominal cavity.  Paracolic gutters were wiped clean with laparotomy tape.  Uterine incision again appeared hemostatic.  The fascia was then closed with 0 Vicryl suture in a running non-locking fashion.  Two separate 0  plain gut sutures were utilized, appeared hemostatic.  The fascia was then closed with 0 Vicryl suture in a running non-locking fashion.  Two separate sutures utilized.  The fascial edges were injected with 0.25% Marcaine .  Approximately 30 mL of the  solution was injected into the fascia.  Subcutaneous tissues were irrigated and subcutaneous tissues were then Bovied for hemostasis.  There was a thickened 4 cm portion of keloid from prior Pfannenstiel incision that was  excised.  The skin was then  reapproximated with Insorb staples and an additional 20 mL of Marcaine  solution was injected beneath the skin.  There were no complications.  X-ray was called at the end of the procedure to perform intraabdominal retained, approximately 20 mL used.   There were no complications.  QUANTITATIVE BLOOD LOSS:  408 mL  INTRAOPERATIVE FLUIDS:  800 mL  URINE OUTPUT:  500 mL  The patient did receive 30 mg of Toradol  at the end of the procedure.  She was prophylaxed during the procedure with 2 g IV Ancef  and 500 mg azithromycin , and she did receive 1 g of tranexamic acid .  The patient was taken to the recovery room in good  condition.   [facility] D: 05/30/2024 5:28:47 am T: 05/30/2024 6:35:00 am  JOB: [id number]/ [id number]

## 2024-05-30 NOTE — Progress Notes (Addendum)
 Verbal consent obtained from patient and support person prior to cesarean section due to emergency.  Dartha Rozzell L. Ailynn Gow, RN BSN 05/30/2024 6:07 AM

## 2024-05-30 NOTE — Progress Notes (Signed)
   05/30/24 0400  Spiritual Encounters  Type of Visit Initial  Care provided to: Family  Referral source Code page  Reason for visit Code  OnCall Visit Yes  Interventions  Spiritual Care Interventions Made Established relationship of care and support;Compassionate presence  Intervention Outcomes  Outcomes Connection to spiritual care   Patient had an Emergency Cesarean and the Chaplain sat with family. Chaplain provided compassionate presence until medical team brought the newborn out. Father and baby went back to room. Mother still being attended to.

## 2024-05-30 NOTE — Transfer of Care (Signed)
 Immediate Anesthesia Transfer of Care Note  Patient: Kelly Eaton  Procedure(s) Performed: CESAREAN DELIVERY  Patient Location: PACU  Anesthesia Type:General  Level of Consciousness: awake, alert , and oriented  Airway & Oxygen Therapy: Patient Spontanous Breathing  Post-op Assessment: Report given to RN and Post -op Vital signs reviewed and stable  Post vital signs: Reviewed and stable  Last Vitals:  Vitals Value Taken Time  BP 139/91 05/30/24 04:57  Temp    Pulse 114   Resp    SpO2 94 % 05/30/24 04:57    Last Pain:  Vitals:   05/30/24 0457  TempSrc:   PainSc: 0-No pain      Patients Stated Pain Goal: 0 (05/29/24 2003)  Complications: No notable events documented.

## 2024-05-30 NOTE — Anesthesia Postprocedure Evaluation (Signed)
 Anesthesia Post Note  Patient: Kelly Eaton  Procedure(s) Performed: CESAREAN DELIVERY  Patient location during evaluation: Mother Baby Anesthesia Type: General Level of consciousness: awake and alert Pain management: pain level controlled Vital Signs Assessment: post-procedure vital signs reviewed and stable Respiratory status: spontaneous breathing, nonlabored ventilation and respiratory function stable Cardiovascular status: stable Postop Assessment: no headache, no backache and epidural receding Anesthetic complications: no   No notable events documented.   Last Vitals:  Vitals:   05/30/24 0705 05/30/24 0825  BP: (!) 124/58 110/67  Pulse: 78 66  Resp: 17 18  Temp:  36.9 C  SpO2: 96%     Last Pain:  Vitals:   05/30/24 0825  TempSrc: Oral  PainSc:                  Debby Mines

## 2024-05-30 NOTE — Brief Op Note (Signed)
 05/29/2024 - 05/30/2024  4:37 AM  PATIENT:  Kelly Eaton  28 y.o. female  PRE-OPERATIVE DIAGNOSIS:  39+3 weeks TOLAC Fetal bradycardia  POST-OPERATIVE DIAGNOSIS:  sameas above Extensive uterine extension  Vigorous female delivered   PROCEDURE:  emergent low transverse cesarean section Retrograde filling of the bladder  SURGEON:  Surgeons and Role:    * Mandy Peeks, Debby PARAS, MD - Primary  PHYSICIAN ASSISTANT: Missy Swanson CNM   ASSISTANTS: none   ANESTHESIA:   general  EBL:  qbl 480 cc iof 800 cc uo 500 cc  BLOOD ADMINISTERED:none  DRAINS: Urinary Catheter (Foley)   LOCAL MEDICATIONS USED:  MARCAINE      SPECIMEN:  No Specimen  DISPOSITION OF SPECIMEN:  N/A  COUNTS:  YES  TOURNIQUET:  * No tourniquets in log *  DICTATION: .Other Dictation: Dictation Number verbal  PLAN OF CARE: Admit to inpatient   PATIENT DISPOSITION:  PACU - hemodynamically stable.   Delay start of Pharmacological VTE agent (>24hrs) due to surgical blood loss or risk of bleeding: not applicable

## 2024-05-30 NOTE — Progress Notes (Addendum)
 Called to bedside to assess patient and reveiew FHR tracing. There was a variable decel to the 90s around 0122, followed by Cat 1 tracing. Variable decels with contractions from 0219-0222. Moderate variability and accels present. SVE showed an anterior lip that was easily reduced with maternal pushing. Kelly Eaton began pushing with good effort. FHR went down to the 80s-90s and did not recover with reposition and fluid bolus. There was a brief recovery to the 150s. Kelly Eaton was positioned in hands and knees and delivery was attempted. Although she was pushing with good effort, delivery was not imminent and fetal head remained at 0 to +1 station with FHR in the 80s-90s. Dr. Leigh called x 3, voicemail left requesting presence at the hospital. Dr. Lovetta called to come emergently to hospital and was en route. Verbal consent for cesarean obtained. Code Cesarean called upon his arrival.  Kelly Eaton, CNM

## 2024-05-30 NOTE — Progress Notes (Signed)
 Labor Progress Note   ASSESSMENT/PLAN   Kelly Eaton 28 y.o.   [id number]  at [redacted]w[redacted]d here for labor management (TOLAC).  FWB:  - Fetal well being assessed: Category I/II        GBS: - GBS negative  LABOR: -  Active labor, doing well. - Kelly Eaton is requesting AROM. Discussed risks and benefits. AROM performed for clear fluid. - Pain Management: Epidural - Anticipate SVD   Labor Progress Cervical Exam  Last 3 exams Dil Eff Sta Exam Time  8 90 -1 0047  7.5 90 -- 0023  5 80 -2 2133    Principal Problem:   Labor and delivery, indication for care Active Problems:   Gestational diabetes mellitus (GDM) affecting pregnancy   SUBJECTIVE/OBJECTIVE   SUBJECTIVE:  Kelly Eaton is still comfortable with her epidural. She is pleased with her progress.  OBJECTIVE: Vital Signs: Patient Vitals for the past 12 hrs:  BP Temp Temp src Pulse Resp SpO2 Height Weight  05/30/24 0029 -- -- -- -- -- 100 % -- --  05/30/24 0024 -- -- -- -- -- 99 % -- --  05/30/24 0014 -- -- -- -- -- 98 % -- --  05/30/24 0009 -- -- -- -- -- 99 % -- --  05/29/24 2359 -- -- -- -- -- 100 % -- --  05/29/24 2354 -- -- -- -- -- 100 % -- --  05/29/24 2350 (!) 103/48 -- -- 67 -- 100 % -- --  05/29/24 2347 (!) 105/51 -- -- 66 -- -- -- --  05/29/24 2344 -- -- -- -- -- 100 % -- --  05/29/24 2340 (!) 89/50 -- -- 78 -- -- -- --  05/29/24 2330 (!) 97/44 -- -- 63 -- 100 % -- --  05/29/24 2325 (!) 103/51 -- -- 65 -- -- -- --  05/29/24 2324 -- -- -- -- -- 100 % -- --  05/29/24 2320 (!) 102/52 -- -- 68 -- -- -- --  05/29/24 2319 -- -- -- -- -- 100 % -- --  05/29/24 2315 (!) 99/54 -- -- 66 -- -- -- --  05/29/24 2314 -- -- -- -- -- 100 % -- --  05/29/24 2310 (!) 100/56 -- -- 77 -- -- -- --  05/29/24 2309 -- -- -- -- -- 100 % -- --  05/29/24 2305 (!) 101/57 -- -- 71 -- -- -- --  05/29/24 2304 -- -- -- -- -- 100 % -- --  05/29/24 2300 (!) 110/53 -- -- 83 -- -- -- --  05/29/24 2259 -- -- -- -- -- 100 % -- --  05/29/24 2255 (!)  96/50 -- -- 69 -- -- -- --  05/29/24 2254 -- -- -- -- -- 100 % -- --  05/29/24 2250 105/76 -- -- 80 -- -- -- --  05/29/24 2249 -- -- -- -- -- 100 % -- --  05/29/24 2245 106/72 -- -- (!) 168 -- -- -- --  05/29/24 2244 -- -- -- -- -- 100 % -- --  05/29/24 2240 (!) 87/52 -- -- 89 -- -- -- --  05/29/24 2239 -- -- -- -- -- 100 % -- --  05/29/24 2237 (!) 103/42 -- -- 67 -- -- -- --  05/29/24 2235 (!) 99/52 -- -- 75 -- -- -- --  05/29/24 2234 -- -- -- -- -- 100 % -- --  05/29/24 2230 (!) 98/46 -- -- 77 -- -- -- --  05/29/24 2229 -- -- -- -- -- 100 % -- --  05/29/24 2225 (!) 100/50 -- -- 48 -- -- -- --  05/29/24 2224 -- -- -- -- -- 100 % -- --  05/29/24 2220 136/66 -- -- 64 -- -- -- --  05/29/24 2219 -- -- -- -- -- 100 % -- --  05/29/24 2218 (!) 97/49 -- -- 75 -- -- -- --  05/29/24 2216 99/60 -- -- 89 -- -- -- --  05/29/24 2214 -- -- -- -- -- 100 % -- --  05/29/24 2213 (!) 99/46 -- -- 73 -- -- -- --  05/29/24 2210 100/64 -- -- -- -- 100 % -- --  05/29/24 2208 (!) 102/49 -- -- 66 -- -- -- --  05/29/24 2206 (!) 99/49 -- -- 79 -- -- -- --  05/29/24 2204 (!) 110/48 -- -- 74 -- 100 % -- --  05/29/24 2202 (!) 103/51 -- -- 67 -- -- -- --  05/29/24 2159 -- -- -- -- -- 99 % -- --  05/29/24 2157 (!) 90/48 -- -- 75 -- -- -- --  05/29/24 2154 -- -- -- -- -- 98 % -- --  05/29/24 2149 -- -- -- -- -- 99 % -- --  05/29/24 2147 110/65 -- -- 90 -- -- -- --  05/29/24 2144 -- -- -- -- -- 97 % -- --  05/29/24 2139 -- -- -- -- -- 97 % -- --  05/29/24 2137 (!) 95/57 -- -- 97 -- -- -- --  05/29/24 2134 107/66 -- -- (!) 103 -- 97 % -- --  05/29/24 2130 114/64 -- -- (!) 107 -- -- -- --  05/29/24 2129 -- -- -- -- -- 99 % -- --  05/29/24 2128 114/60 -- -- (!) 105 -- -- -- --  05/29/24 2125 117/72 -- -- 99 -- -- -- --  05/29/24 2124 -- -- -- -- -- 100 % -- --  05/29/24 2119 -- -- -- -- -- 99 % -- --  05/29/24 2114 -- -- -- -- -- 100 % -- --  05/29/24 2109 -- -- -- -- -- 99 % -- --  05/29/24 2104 -- -- -- -- --  100 % -- --  05/29/24 2059 -- -- -- -- -- 98 % -- --  05/29/24 2055 132/80 -- -- 71 -- -- -- --  05/29/24 2003 -- -- -- -- -- -- 4' 10 (1.473 m) 74.8 kg  05/29/24 1535 128/65 98.1 F (36.7 C) Oral 80 18 -- -- --  05/29/24 1532 -- -- -- -- -- -- 4' 11 (1.499 m) 74.8 kg    Last SVE:  Dilation: 8 Effacement (%): 90 Cervical Position: Middle Station: -1 Presentation: Vertex Exam by:: Bonnye Halle, CNM -  , Rupture Date: 05/30/24, Rupture Time: 0047,    FHR:   - Baseline: 135 - Variability: moderate - Accels: present - Decels: variable/late after epidural placement; none since ~2230 Category I/II  UTERINE ACTIVITY:  Contractions: q 2-4 minutes  Kelly Eaton, CNM

## 2024-05-31 ENCOUNTER — Encounter: Payer: Self-pay | Admitting: Obstetrics and Gynecology

## 2024-05-31 LAB — CBC
HCT: 27.2 % — ABNORMAL LOW (ref 36.0–46.0)
Hemoglobin: 9.2 g/dL — ABNORMAL LOW (ref 12.0–15.0)
MCH: 33 pg (ref 26.0–34.0)
MCHC: 33.8 g/dL (ref 30.0–36.0)
MCV: 97.5 fL (ref 80.0–100.0)
Platelets: 159 K/uL (ref 150–400)
RBC: 2.79 MIL/uL — ABNORMAL LOW (ref 3.87–5.11)
RDW: 13.9 % (ref 11.5–15.5)
WBC: 12.3 K/uL — ABNORMAL HIGH (ref 4.0–10.5)
nRBC: 0 % (ref 0.0–0.2)

## 2024-05-31 MED ORDER — FERROUS SULFATE 325 (65 FE) MG PO TABS
325.0000 mg | ORAL_TABLET | Freq: Every day | ORAL | Status: DC
  Administered 2024-06-01: 325 mg via ORAL
  Filled 2024-05-31: qty 1

## 2024-05-31 MED ORDER — ESCITALOPRAM OXALATE 10 MG PO TABS
10.0000 mg | ORAL_TABLET | Freq: Every day | ORAL | Status: DC
  Administered 2024-06-01: 10 mg via ORAL
  Filled 2024-05-31: qty 1

## 2024-05-31 NOTE — Clinical Social Work Maternal (Addendum)
 CLINICAL SOCIAL WORK MATERNAL/CHILD NOTE  Patient Details  Name: Kelly Eaton MRN: [id number] Date of Birth: Dec 18, 1995  Date:  05/31/2024  Clinical Social Worker Initiating Note:  Corrie Ruts Date/Time: Initiated:   /0945     Child's Name:  Kelly Eaton   Biological Parents:  Mother, Father   Need for Interpreter:  None   Reason for Referral:  Behavioral Health Concerns   Address:  [address] [place] [facility] [address]    Phone number:  [phone] (home)     Additional phone number:   Household Members/Support Persons (HM/SP):   Household Member/Support Person 1, Household Member/Support Person 2   HM/SP Name Relationship DOB or Age  HM/SP -1 Alex Portell FOB 29  HM/SP -2 [employer] 2 Son  HM/SP -3        HM/SP -4        HM/SP -5        HM/SP -6        HM/SP -7        HM/SP -8          Natural Supports (not living in the home):  Immediate Family, Friends, Counselling psychologist, Extended Family   Professional Supports:     Employment: Environmental education officer   Type of Work: Runner, broadcasting/film/video for [employer]   Education:  Engineer, maintenance (IT)   Homebound arranged:    Surveyor, quantity Resources:  Media planner    Other Resources:      Cultural/Religious Considerations Which May Impact Care:  =  Strengths:  Ability to meet basic needs  , Compliance with medical plan  , Home prepared for child  , Pediatrician chosen   Psychotropic Medications:         Pediatrician:    [employer]  Pediatrician List:   [employer]    [place]    [facility]  [facility]      Pediatrician Fax Number:    Risk Factors/Current Problems:  Mental Health Concerns     Cognitive State:  Alert     Mood/Affect:  Comfortable  , Calm  , Interested  , Relaxed     CSW Assessment:  Chart reviewed. I spoke with Norlene Eaton at bedside today. The patient husband, brother, and sister in law was present. I introduced myself, my role,  reason for consult, and HIPAA. The patient consented to family members staying present for the consult.   The patient reports that she was not well after birth. The patient reports that the baby name is Kelly Eaton. The patient confirmed that her address is [address] [place], [address] and telephone number is [phone].   The patient reports that she has support from her parents, grandparents, siblings, coworkers, and friends. The patient confirms that she lives in the home with FOB, Alex Chaves(30) and son, Rainey Rodger). 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 The patient reports that she makes to much to qualify for [facility] or Food stamps.   The patient reports that she was previously diagnosis with major depressive disorder and panic disorder. The patient confirms that she takes Lexipro to help manage her symptoms. The patient also confirms that she sees a psychiatrist once a month and her therapist through [facility] once every week.   The patient denies any current or past SI/HI/DV during the time of the assessment.  I reviewed information on Post Partum Depression, Sudden Infant Death Syndrome, Car seat safety, safe sleep environment, and additional mental health resources and post partum resources. The patient verbalized understanding.   I consulted with the patient for [place] Depression Scale score of 9.  The patient has mental health support already in place. I encouraged the patient to reach out to [facility] for any additional needs before discharge. The patient verbalized understanding.   The patient confirms that she has a crib, bassinet, diapers, car seat, and clothes for the baby. The patient reports that she will try to breast feed and has a breast pump. The patient also reports that the baby will have follow up care with [facility].   The patient reports that the FOB will help  with transportation during discharge. TOC will follow the patient and the baby until discharge for any other resources or needs.   CSW Plan/Description:  Sudden Infant Death Syndrome (SIDS) Education, Perinatal Mood and Anxiety Disorder (PMADs) Education    Corrie JINNY Ruts, LCSW 05/31/2024, 10:06 AM

## 2024-05-31 NOTE — Lactation Note (Signed)
 This note was copied from a baby's chart. Lactation Consultation Note  Patient Name: Kelly Eaton Date: 05/31/2024 Age:29 hours Reason for consult: Follow-up assessment  LC to MOB room to follow up from yesterday's assessment. MOB asleep with infant sleeping and swaddled in bassinet at bedside. FOB awake and updated feeding chart, reporting that feeding appears to be going well and that cluster feeding is occurring every hour or so.  Maternal Data  MOB resting, FOB states she is continuing to pump and offer breasts per infant hunger cues.   Feeding Mother's Current Feeding Choice: Breast Milk and Donor Milk    Lactation Tools Discussed/Used  DEBP and infant feeding/maternal milk removal production feedback mechanism. Encouraged MOB reach out to nurses with feeding questions to support dyad through the night, with LC to check in with family upon am arrival.   Interventions Interventions: Education;Pace feeding;Infant Driven Feeding Algorithm education Education provided regarding bottle preferences due to flow rate and ability to switch infant back over to feeding at breast if MOB should desire to work further with lactation, with both inpatient and outpatient options available.  Discharge Discharge Education: Warning signs for feeding baby Feeding observation needed for additional assessment.   Consult Status Consult Status: Follow-up Date: 06/01/24 Follow-up type: In-patient    Donald JONETTA Minerva 05/31/2024, 4:47 PM

## 2024-05-31 NOTE — Progress Notes (Signed)
 Subjective:   SOLACE MANWARREN had a stat c/s on 05/30/24 for NRFHTs. She was a TOLAC and NRFHTs began during second stage, was still remote from delivery so stat section was performed. Extensive uterine laceration was identified during cesarean section. Has had routine postpartum care.  Pt. Is eating, hydrating, and voiding regularly without difficulty. Has yet to have BM. She is breastfeeding and supplementing with formula. Reports small amount of vaginal bleeding, denies passing large blood clots. Has had incisional abdominal pain relieved with tylenol /ibuprofen , toradol , and gabapentin . Endorses good support from partner and family. Rai and her partner both had lots of questions about her birth which were answered to the best of my ability.   Objective:  Vital signs in last 24 hours: Temp:  [98.1 F (36.7 C)-98.9 F (37.2 C)] 98.1 F (36.7 C) (08/31 0800) Pulse Rate:  [61-97] 63 (08/31 0800) Resp:  [17-20] 17 (08/31 0800) BP: (98-114)/(57-66) 99/66 (08/31 0800) SpO2:  [91 %-99 %] 96 % (08/31 0800)    General: NAD Pulmonary: no increased work of breathing Breasts: soft, non-tender, nipples without breakdown Abdomen: soft, non-tender Fundus: firm, midline, at umbilicus Lochia: light rubra, no clots Incision: pressure dressing intact, no drainage on dressing Perineum: no erythema or foul odor discharge, minimal edema, laceration well approximated  Extremities: no edema, no erythema, no tenderness  Results for orders placed or performed during the hospital encounter of 05/29/24 (from the past 72 hours)  CBC     Status: Abnormal   Collection Time: 05/29/24  8:01 PM  Result Value Ref Range   WBC 14.0 (H) 4.0 - 10.5 K/uL   RBC 3.88 3.87 - 5.11 MIL/uL   Hemoglobin 13.0 12.0 - 15.0 g/dL   HCT 63.5 63.9 - 53.9 %   MCV 93.8 80.0 - 100.0 fL   MCH 33.5 26.0 - 34.0 pg   MCHC 35.7 30.0 - 36.0 g/dL   RDW 86.7 88.4 - 84.4 %   Platelets 234 150 - 400 K/uL   nRBC 0.0 0.0 - 0.2 %     Comment: Performed at [facility], [address] [place], [place] [address]  Type and screen [facility]     Status: None   Collection Time: 05/29/24  8:01 PM  Result Value Ref Range   ABO/RH(D) O POS    Antibody Screen NEG    Sample Expiration      06/01/2024,2359 Performed at [facility], [address] [place], [place] [address]   RPR     Status: None   Collection Time: 05/29/24  8:01 PM  Result Value Ref Range   RPR Ser Ql NON REACTIVE NON REACTIVE    Comment: Performed at [facility], [address] [place], [place] [address]  Glucose, capillary     Status: Abnormal   Collection Time: 05/29/24  8:30 PM  Result Value Ref Range   Glucose-Capillary 157 (H) 70 - 99 mg/dL    Comment: Glucose reference range applies only to samples taken after fasting for at least 8 hours.  Glucose, capillary     Status: Abnormal   Collection Time: 05/30/24 12:21 AM  Result Value Ref Range   Glucose-Capillary 142 (H) 70 - 99 mg/dL    Comment: Glucose reference range applies only to samples taken after fasting for at least 8 hours.  ABO/Rh     Status: None   Collection Time: 05/30/24  5:18 AM  Result Value Ref Range   ABO/RH(D)  O POS Performed at [facility], [address] [place], [place] [address]   CBC     Status: Abnormal   Collection Time: 05/30/24  7:47 AM  Result Value Ref Range   WBC 22.0 (H) 4.0 - 10.5 K/uL   RBC 3.57 (L) 3.87 - 5.11 MIL/uL   Hemoglobin 11.5 (L) 12.0 - 15.0 g/dL   HCT 65.8 (L) 63.9 - 53.9 %   MCV 95.5 80.0 - 100.0 fL   MCH 32.2 26.0 - 34.0 pg   MCHC 33.7 30.0 - 36.0 g/dL   RDW 86.7 88.4 - 84.4 %   Platelets 211 150 - 400 K/uL   nRBC 0.0 0.0 - 0.2 %    Comment: Performed at [facility], [address] [place], [place] [address]  Creatinine, serum     Status: None   Collection Time: 05/30/24  7:47 AM  Result Value Ref Range   Creatinine, Ser 0.67 0.44 - 1.00 mg/dL   GFR,  Estimated >39 >39 mL/min    Comment: (NOTE) Calculated using the CKD-EPI Creatinine Equation (2021) Performed at [facility], [address] [place], [place] [address]   CBC     Status: Abnormal   Collection Time: 05/31/24  6:25 AM  Result Value Ref Range   WBC 12.3 (H) 4.0 - 10.5 K/uL   RBC 2.79 (L) 3.87 - 5.11 MIL/uL   Hemoglobin 9.2 (L) 12.0 - 15.0 g/dL   HCT 72.7 (L) 63.9 - 53.9 %   MCV 97.5 80.0 - 100.0 fL   MCH 33.0 26.0 - 34.0 pg   MCHC 33.8 30.0 - 36.0 g/dL   RDW 86.0 88.4 - 84.4 %   Platelets 159 150 - 400 K/uL   nRBC 0.0 0.0 - 0.2 %    Comment: Performed at [facility], [address], [place], [place] [address]    Assessment:   28 y.o. [phone] 1 day(s)  s/p c/s Breastfeeding Anemia secondary to acute blood loss- hemodynamically stable and symptomatic VSS Pain well controlled  Plan:    PO Fe Blood Type --/--/O POS Performed at [facility], [address] [place], [place] [address]  [phone]) / Dama 6.07 (02/21 1559) / Varicella Immune Rhogam not indicated Tdap/varicella/rubella to be offered before discharge if indicated Feeding plan breast, lactation support Encouraged to continue breastfeeding, BF education on latch, position changes, cluster feeding, hunger cues, lactogenesis II, milk supply Continued routine postpartum care  Counseled on normal uterine involution and vaginal bleeding postpartum Anticipate discharge home tomorrow    Lolita Loots, FNP, CNM [facility] 05/31/2024, 12:24 PM

## 2024-06-01 MED ORDER — TRAMADOL HCL 50 MG PO TABS: 50.0000 mg | ORAL_TABLET | Freq: Four times a day (QID) | ORAL | 0 refills | Status: AC | PRN

## 2024-06-01 MED ORDER — OXYCODONE HCL 5 MG PO TABS: 5.0000 mg | ORAL_TABLET | Freq: Four times a day (QID) | ORAL | 0 refills | Status: DC | PRN

## 2024-06-01 NOTE — Lactation Note (Signed)
 This note was copied from a baby's chart. Lactation Consultation Note  Patient Name: Kelly Eaton Unijb'd Date: 06/01/2024 Age:29 hours Reason for consult: Follow-up assessment;Term;Other (Comment) (Discharge Education)   Maternal Data Follow up assessment and discharge education.  Patient preparing for discharge.  Patient stated that feedings are going well with the NS.  Feeding Mother's Current Feeding Choice: Breast Milk and Formula  Lactation Tools Discussed/Used Tools: Nipple Shields  Interventions Interventions: Breast feeding basics reviewed;Education;CDC milk storage guidelines  Discharge Discharge Education: Engorgement and breast care;Outpatient recommendation  Education on engorgement prevention/treatment was discussed as well as breastmilk storage guidelines.  LC provided patient with a handout on breastmilk storage guidelines from [facility]. [facility] outpatient lactation services phone number written on the white board in the room.  Patient verbalized understanding.  Consult Status Consult Status: Complete Follow-up type: Call as needed    [name] [name] 06/01/2024, 10:49 AM

## 2024-06-01 NOTE — Progress Notes (Signed)
 Discharge instructions reviewed with patient and significant other.  Questions answered and follow up care reviewed.  Printed copies given to patient for reference after discharge home.

## 2024-06-02 ENCOUNTER — Encounter: Admitting: Certified Nurse Midwife

## 2024-06-03 ENCOUNTER — Inpatient Hospital Stay: Admit: 2024-06-03

## 2024-06-04 NOTE — Progress Notes (Unsigned)
   Postoperative Cesarean Incision Check Kelly Eaton is a 28 y.o. [id number] s/p rLTCS under GETA w/ Dr. ONEIDA. Schermerhorn at [redacted]w[redacted]d for fetal bradycardia and intolerance to labor during TOLAC, POD#10, here today for incision check.  Subjective: Pain is controlled with current analgesics. Medications being used: ibuprofen  (OTC). She denies fever, chills, nausea, and vomiting. Eating a regular diet without difficulty.  Is having regular bowel movements. Voiding without difficulty or incontinence. Activity: normal activities of daily living. Bleeding is scant. She denies issues with her incision.    Pt has upcoming appointment with Dr. Lovetta to discuss her intraoperative findings re: possible uterine rupture. Pt and husband have some concerns today they would like to discuss surrounding how her surgery was handled.   Objective: BP 125/73   Pulse 68   Wt 146 lb 14.4 oz (66.6 kg)   Breastfeeding Yes   BMI 30.70 kg/m  Body mass index is 30.7 kg/m.  General:  alert and no distress  Abdomen: soft, bowel sounds active, non-tender  Incision:   healing well, no drainage, no erythema, no hernia, no seroma, no swelling, no dehiscence, incision well approximated    Assessment/Plan: Kelly Eaton is a 28 y.o. [id number] s/p pLTCS at [redacted]w[redacted]d for Fetal bradycardia with fetal intolerance to labor , POD#10, here today for incision check, healing well. No concerns with incision today, remaining steri-strips removed and replaced.  -Discussed at-home care, healing expectations, si/sx of infection.  -Call clinic if developing redness, discharge, or increasing pain. -Avoid vigorous scrubbing/washing of incision site; hygiene reviewed. -May trim back steri-strips if they peel; should fully remove after 1 week from today. -May resume driving and light walking. Still no heavy lifting >10-12 lbs and pelvic rest advised until cleared at 6wk postpartum visit.  -If stooling regularly x 1-2 weeks, can take stool  softener every other day x 1 week, taper as tolerated.  Return in about 4 weeks (around 07/07/2024) for 6 week postpartum.   Estil Mangle, DO [facility]

## 2024-06-09 ENCOUNTER — Encounter: Payer: Self-pay | Admitting: Obstetrics

## 2024-06-09 ENCOUNTER — Ambulatory Visit (INDEPENDENT_AMBULATORY_CARE_PROVIDER_SITE_OTHER): Admitting: Obstetrics

## 2024-07-07 ENCOUNTER — Ambulatory Visit: Admitting: Obstetrics

## 2024-07-27 ENCOUNTER — Encounter: Payer: Self-pay | Admitting: Internal Medicine

## 2024-07-27 ENCOUNTER — Ambulatory Visit: Admitting: Internal Medicine

## 2024-07-27 ENCOUNTER — Ambulatory Visit

## 2024-07-27 VITALS — BP 106/72 | HR 74 | Temp 98.2°F | Ht 59.0 in | Wt 144.6 lb

## 2024-07-27 DIAGNOSIS — R5383 Other fatigue: Secondary | ICD-10-CM | POA: Insufficient documentation

## 2024-07-27 DIAGNOSIS — F419 Anxiety disorder, unspecified: Secondary | ICD-10-CM | POA: Diagnosis not present

## 2024-07-27 DIAGNOSIS — Z13220 Encounter for screening for lipoid disorders: Secondary | ICD-10-CM | POA: Diagnosis not present

## 2024-07-27 DIAGNOSIS — Z98891 History of uterine scar from previous surgery: Secondary | ICD-10-CM

## 2024-07-27 DIAGNOSIS — R109 Unspecified abdominal pain: Secondary | ICD-10-CM

## 2024-07-27 DIAGNOSIS — F439 Reaction to severe stress, unspecified: Secondary | ICD-10-CM

## 2024-07-27 DIAGNOSIS — Z8632 Personal history of gestational diabetes: Secondary | ICD-10-CM

## 2024-07-27 DIAGNOSIS — R002 Palpitations: Secondary | ICD-10-CM

## 2024-07-27 NOTE — Assessment & Plan Note (Signed)
 Taking lexapro . Feels she is doing well on this medication. On 10mg  now. Continue f/u with psychiatry.

## 2024-07-27 NOTE — Progress Notes (Signed)
 Subjective:    Patient ID: Kelly Eaton, female    DOB: [birth year], 28 y.o.   MRN: [id number]  Patient here for  Chief Complaint  Patient presents with   Medical Management of Chronic Issues    HPI Here for a scheduled follow up. Gave birth 05/30/24. S/p c section. Had f/u with OB/gyn 07/13/24 - discussed nexplanon . Planning to have inserted. She is doing well. Baby is doing well. Breast feeing. Bleeding has stopped. Wound has healed well. Breathing stable. Handling stress. Some fatigue. Planning to start back to work next week.    Past Medical History:  Diagnosis Date   Anxiety    Chest pain    Depression    Frequent headaches    cluster headaches.  had MRI.    Hx of migraines    Past Surgical History:  Procedure Laterality Date   CESAREAN SECTION     CESAREAN SECTION N/A 05/30/2024   Procedure: CESAREAN DELIVERY;  Surgeon: Lovetta, Debby PARAS, MD;  Location: [facility] ORS;  Service: Obstetrics;  Laterality: N/A;   MOUTH SURGERY  10/02/2011   Family History  Problem Relation Age of Onset   Heart disease Maternal Grandfather    Hypertension Maternal Grandfather    Heart attack Maternal Grandfather    Alcohol abuse Paternal Grandfather    Hypertension Paternal Grandfather    Early death Paternal Grandfather    Heart attack Paternal Grandfather    Social History   Socioeconomic History   Marital status: Married    Spouse name: Marolyn   Number of children: 1   Years of education: Not on file   Highest education level: Bachelor's degree (e.g., BA, AB, BS)  Occupational History   Not on file  Tobacco Use   Smoking status: Never    Passive exposure: Never   Smokeless tobacco: Never  Vaping Use   Vaping status: Never Used  Substance and Sexual Activity   Alcohol use: Not Currently   Drug use: Never   Sexual activity: Yes    Birth control/protection: I.U.D.    Comment: planning IUD  Other Topics Concern   Not on file  Social History Narrative   Not on file    Social Drivers of Health   Financial Resource Strain: Low Risk  (07/26/2024)   Overall Financial Resource Strain (CARDIA)    Difficulty of Paying Living Expenses: Not hard at all  Food Insecurity: No Food Insecurity (07/26/2024)   Hunger Vital Sign    Worried About Running Out of Food in the Last Year: Never true    Ran Out of Food in the Last Year: Never true  Transportation Needs: No Transportation Needs (07/26/2024)   PRAPARE - Administrator, Civil Service (Medical): No    Lack of Transportation (Non-Medical): No  Physical Activity: Insufficiently Active (07/26/2024)   Exercise Vital Sign    Days of Exercise per Week: 5 days    Minutes of Exercise per Session: 20 min  Stress: No Stress Concern Present (07/26/2024)   [employer] of Occupational Health - Occupational Stress Questionnaire    Feeling of Stress: Not at all  Social Connections: Moderately Isolated (07/26/2024)   Social Connection and Isolation Panel    Frequency of Communication with Friends and Family: More than three times a week    Frequency of Social Gatherings with Friends and Family: More than three times a week    Attends Religious Services: Never    Database Administrator or Organizations: No  Attends Banker Meetings: Not on file    Marital Status: Married     Review of Systems  Constitutional:  Positive for fatigue. Negative for appetite change and unexpected weight change.  HENT:  Negative for congestion and sinus pressure.   Respiratory:  Negative for cough, chest tightness and shortness of breath.   Cardiovascular:  Negative for chest pain, palpitations and leg swelling.  Gastrointestinal:  Negative for abdominal pain, diarrhea, nausea and vomiting.  Genitourinary:  Negative for difficulty urinating and dysuria.  Musculoskeletal:  Negative for joint swelling and myalgias.  Skin:  Negative for color change and rash.  Neurological:  Negative for dizziness and  headaches.  Psychiatric/Behavioral:  Negative for agitation and dysphoric mood.        Objective:     BP 106/72   Pulse 74   Temp 98.2 F (36.8 C) (Oral)   Ht 4' 11 (1.499 m)   Wt 144 lb 9.6 oz (65.6 kg)   LMP 08/31/2023 (Approximate)   SpO2 96%   Breastfeeding Yes   BMI 29.21 kg/m  Wt Readings from Last 3 Encounters:  07/27/24 144 lb 9.6 oz (65.6 kg)  06/09/24 146 lb 14.4 oz (66.6 kg)  05/29/24 165 lb (74.8 kg)    Physical Exam Vitals reviewed.  Constitutional:      General: She is not in acute distress.    Appearance: Normal appearance.  HENT:     Head: Normocephalic and atraumatic.     Right Ear: External ear normal.     Left Ear: External ear normal.     Mouth/Throat:     Pharynx: No oropharyngeal exudate or posterior oropharyngeal erythema.  Eyes:     General: No scleral icterus.       Right eye: No discharge.        Left eye: No discharge.     Conjunctiva/sclera: Conjunctivae normal.  Neck:     Thyroid : No thyromegaly.  Cardiovascular:     Rate and Rhythm: Normal rate and regular rhythm.  Pulmonary:     Effort: No respiratory distress.     Breath sounds: Normal breath sounds. No wheezing.  Abdominal:     General: Bowel sounds are normal.     Palpations: Abdomen is soft.     Tenderness: There is no abdominal tenderness.  Musculoskeletal:        General: No swelling or tenderness.     Cervical back: Neck supple. No tenderness.  Lymphadenopathy:     Cervical: No cervical adenopathy.  Skin:    Findings: No erythema or rash.  Neurological:     Mental Status: She is alert.  Psychiatric:        Mood and Affect: Mood normal.        Behavior: Behavior normal.         Outpatient Encounter Medications as of 07/27/2024  Medication Sig   escitalopram  (LEXAPRO ) 10 MG tablet Take 10 mg by mouth.   Prenatal Vit-Fe Fumarate-FA (PRENATAL PO) Take by mouth daily.   [DISCONTINUED] escitalopram  (LEXAPRO ) 20 MG tablet Take 10 mg by mouth daily.   No  facility-administered encounter medications on file as of 07/27/2024.     Lab Results  Component Value Date   WBC 12.3 (H) 05/31/2024   HGB 9.2 (L) 05/31/2024   HCT 27.2 (L) 05/31/2024   PLT 159 05/31/2024   GLUCOSE 79 06/19/2016   CHOL 146 11/15/2015   TRIG 79.0 11/15/2015   HDL 54.90 11/15/2015   LDLCALC 76 11/15/2015  ALT 13 06/19/2016   AST 16 06/19/2016   NA 139 06/19/2016   K 3.9 06/19/2016   CL 105 06/19/2016   CREATININE 0.67 05/30/2024   BUN 22 06/19/2016   CO2 27 06/19/2016   TSH 0.861 10/31/2021   GLUF 79 03/18/2024    DG Abd 1 View Result Date: 05/30/2024 CLINICAL DATA:  28 year old female is status post emergency cesarean. Indeterminate radiopaque foreign body projecting over the mid abdomen and right upper quadrant on initial postoperative radiograph. EXAM: ABDOMEN - 1 VIEW COMPARISON:  0429 hours today. FINDINGS: Portable AP supine view at 0506 hours. No persistent radiopaque foreign body now. Negative portable appearance of the abdomen and pelvis. Incidental pelvic phleboliths. Nonobstructed bowel-gas pattern. No osseous abnormality identified. IMPRESSION: Negative.  No persistent radiopaque foreign body identified. Electronically Signed   By: VEAR Hurst M.D.   On: 05/30/2024 05:23   DG Abd Portable 1V Result Date: 05/30/2024 CLINICAL DATA:  28 year old female is status post emergency cesarean. EXAM: PORTABLE ABDOMEN - 1 VIEW COMPARISON:  Lumbar radiographs 07/29/2011. FINDINGS: Portable AP supine view at 0429 hours. Elongated curvilinear, string like opacity projects in the mid abdomen near the level of the umbilicus, tracks into the right upper quadrant and off the top of the film. No retained radiopaque foreign body projecting over the pelvis, with several pelvic phleboliths. Paucity of bowel gas in the pelvis. Nonobstructive bowel gas elsewhere in the abdomen. No osseous abnormality identified. IMPRESSION: Indeterminate curvilinear radiopaque foreign body looped in the  mid abdomen at the level of the umbilicus and tracking into the right upper quadrant. No radiopaque foreign body identified in the pelvis. Study discussed by telephone with Dr. DEBBY DINSMORE on 05/30/2024 at 04:54. He advised possible gown artifact on this image. We will repeat a Portable Abdomen with clothing removed. Electronically Signed   By: VEAR Hurst M.D.   On: 05/30/2024 04:57       Assessment & Plan:  Other fatigue Assessment & Plan: Feel is multifactorial. Just gave birth 8 weeks ago. Breast feeding. Will follow. Notify me if persistent problems. Wanted to hold on f/u labs today.   Orders: -     CBC with Differential/Platelet; Future -     Comprehensive metabolic panel with GFR; Future -     TSH; Future  Screening cholesterol level -     Lipid panel; Future  Anxiety Assessment & Plan: Taking lexapro . Feels she is doing well on this medication. On 10mg  now. Continue f/u with psychiatry.    History of cesarean delivery Assessment & Plan: Wound has healed well.    History of gestational diabetes Assessment & Plan: Will follow sugars - post partum.    Palpitations Assessment & Plan: Resolved.    Stress Assessment & Plan: Overall appears to be handling things well. Continue lexapro . Continue f/u with psychiatry.       Allena Hamilton, MD

## 2024-07-27 NOTE — Assessment & Plan Note (Signed)
Wound has healed well.

## 2024-07-27 NOTE — Assessment & Plan Note (Signed)
 Resolved

## 2024-07-27 NOTE — Assessment & Plan Note (Signed)
 Overall appears to be handling things well. Continue lexapro . Continue f/u with psychiatry.

## 2024-07-27 NOTE — Assessment & Plan Note (Signed)
 Will follow sugars - post partum.

## 2024-07-27 NOTE — Assessment & Plan Note (Signed)
 Feel is multifactorial. Just gave birth 8 weeks ago. Breast feeding. Will follow. Notify me if persistent problems. Wanted to hold on f/u labs today.

## 2024-08-18 ENCOUNTER — Telehealth: Admitting: Physician Assistant

## 2024-08-18 DIAGNOSIS — J208 Acute bronchitis due to other specified organisms: Secondary | ICD-10-CM

## 2024-08-18 MED ORDER — ALBUTEROL SULFATE HFA 108 (90 BASE) MCG/ACT IN AERS: 2.0000 | INHALATION_SPRAY | Freq: Four times a day (QID) | RESPIRATORY_TRACT | 0 refills | Status: AC | PRN

## 2024-08-18 NOTE — Progress Notes (Signed)
 We are sorry that you are not feeling well.  Here is how we plan to help!  Based on your presentation I believe you most likely have A cough due to a virus.  This is called viral bronchitis and is best treated by rest, plenty of fluids and control of the cough.  You may use Ibuprofen  or Tylenol  as directed to help your symptoms.     In addition you may use A non-prescription cough medication called Mucinex DM: take 2 tablets every 12 hours. I have sent in a script for albuterol to help relax your airways.  From your responses in the eVisit questionnaire you describe inflammation in the upper respiratory tract which is causing a significant cough.  This is commonly called Bronchitis and has four common causes:   Allergies Viral Infections Acid Reflux Bacterial Infection Allergies, viruses and acid reflux are treated by controlling symptoms or eliminating the cause. An example might be a cough caused by taking certain blood pressure medications. You stop the cough by changing the medication. Another example might be a cough caused by acid reflux. Controlling the reflux helps control the cough.  USE OF BRONCHODILATOR (RESCUE) INHALERS: There is a risk from using your bronchodilator too frequently.  The risk is that over-reliance on a medication which only relaxes the muscles surrounding the breathing tubes can reduce the effectiveness of medications prescribed to reduce swelling and congestion of the tubes themselves.  Although you feel brief relief from the bronchodilator inhaler, your asthma may actually be worsening with the tubes becoming more swollen and filled with mucus.  This can delay other crucial treatments, such as oral steroid medications. If you need to use a bronchodilator inhaler daily, several times per day, you should discuss this with your provider.  There are probably better treatments that could be used to keep your asthma under control.     HOME CARE Only take medications as  instructed by your medical team. Complete the entire course of an antibiotic. Drink plenty of fluids and get plenty of rest. Avoid close contacts especially the very young and the elderly Cover your mouth if you cough or cough into your sleeve. Always remember to wash your hands A steam or ultrasonic humidifier can help congestion.   GET HELP RIGHT AWAY IF: You develop worsening fever. You become short of breath You cough up blood. Your symptoms persist after you have completed your treatment plan MAKE SURE YOU  Understand these instructions. Will watch your condition. Will get help right away if you are not doing well or get worse.  Your e-visit answers were reviewed by a board certified advanced clinical practitioner to complete your personal care plan.  Depending on the condition, your plan could have included both over the counter or prescription medications. If there is a problem please reply  once you have received a response from your provider. Your safety is important to us .  If you have drug allergies check your prescription carefully.    You can use MyChart to ask questions about today's visit, request a non-urgent call back, or ask for a work or school excuse for 24 hours related to this e-Visit. If it has been greater than 24 hours you will need to follow up with your provider, or enter a new e-Visit to address those concerns. You will get an e-mail in the next two days asking about your experience.  I hope that your e-visit has been valuable and will speed your recovery. Thank you  for using e-visits.   I have spent 5 minutes in review of e-visit questionnaire, review and updating patient chart, medical decision making and response to patient.   Elsie Velma Lunger, PA-C

## 2024-08-19 ENCOUNTER — Ambulatory Visit: Payer: Self-pay

## 2024-08-19 NOTE — Telephone Encounter (Signed)
 FYI Only or Action Required?: FYI only for provider: appointment scheduled on 11.20.25.  Patient was last seen in primary care on 07/27/2024 by [name], MD.  Called Nurse Triage reporting Cough.  Symptoms began a week ago.  Interventions attempted: OTC medications: mucinex and Prescription medications: albuterol .  Symptoms are: unchanged.  Triage Disposition: See Physician Within 24 Hours  Patient/caregiver understands and will follow disposition?: Yes      Copied from CRM [id number]. Topic: Clinical - Red Word Triage >> Aug 19, 2024  2:39 PM Alfonso ORN wrote: Red Word that prompted transfer to Nurse Triage: symptoms: sore throat, worsening cough OTC not working, sinus pressure Reason for Disposition  [1] Using nasal washes and pain medicine > 24 hours AND [2] sinus pain (around cheekbone or eye) persists  Answer Assessment - Initial Assessment Questions Pt states she filled out a questionnaire to do a a virtual visit. She states they sent her a message back to do mucinex dm (which she had been doing) and the albuterol  which she stated she started today and feels like it's doing something when Rn asked if it was helping. She states she has had this sinus drainage, sore throat, cough x 1 week. She states that She began to notice flecks of blood in the sputum she is coughing up. She states she is also having a sore throat, states it is worse in the morning and feels raw. She denies any fever. 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 Denies any shortness of breath, does say she has some cp with coughing. RN gave instructions on albuterol  and told to hold mucinex DM as she said she had weird feeling taking it and her lexapro  together. RN did give care advise and when to go to the ER. Pt stated understanding.       1. ONSET: When did the cough begin?       1 week ago 2. SEVERITY: How bad is the cough today?      mod 3. SPUTUM: Describe the color of your sputum (e.g., none, dry cough; clear, white, yellow, green)     Greenish/yellow 4. HEMOPTYSIS: Are you coughing up any blood? If Yes, ask: How much? (e.g., flecks, streaks, tablespoons, etc.)     flecks 5. DIFFICULTY BREATHING: Are you having difficulty breathing? If Yes, ask: How bad is it? (e.g., mild, moderate, severe)      denies 6. FEVER: Do you have a fever? If Yes, ask: What is your temperature, how was it measured, and when did it start?     denies 7. CARDIAC HISTORY: Do you have any history of heart disease? (e.g., heart attack, congestive heart failure)      palpitions 8. LUNG HISTORY: Do you have any history of lung disease?  (e.g., pulmonary embolus, asthma, emphysema)     no 9. PE RISK FACTORS: Do you have a history of blood clots? (or: recent major surgery, recent prolonged travel, bedridden)     no 10. OTHER SYMPTOMS: Do you have any other symptoms? (e.g., runny nose, wheezing, chest pain)       Nasal drainage  Protocols used: Cough - Acute Productive-A-AH

## 2024-08-20 ENCOUNTER — Encounter: Payer: Self-pay | Admitting: Nurse Practitioner

## 2024-08-20 ENCOUNTER — Ambulatory Visit: Admitting: Nurse Practitioner

## 2024-08-20 VITALS — BP 120/70 | HR 82 | Temp 97.8°F | Ht 59.0 in | Wt 142.4 lb

## 2024-08-20 DIAGNOSIS — J0140 Acute pansinusitis, unspecified: Secondary | ICD-10-CM

## 2024-08-20 DIAGNOSIS — R6889 Other general symptoms and signs: Secondary | ICD-10-CM | POA: Diagnosis not present

## 2024-08-20 LAB — POCT INFLUENZA A/B
Influenza A, POC: NEGATIVE
Influenza B, POC: NEGATIVE

## 2024-08-20 LAB — POC COVID19 BINAXNOW: SARS Coronavirus 2 Ag: NEGATIVE

## 2024-08-20 MED ORDER — AMOXICILLIN-POT CLAVULANATE 875-125 MG PO TABS: 1.0000 | ORAL_TABLET | Freq: Two times a day (BID) | ORAL | 0 refills | Status: AC

## 2024-08-20 NOTE — Progress Notes (Unsigned)
 Leron Glance, NP-C Phone: [phone]  Kelly Eaton is a 28 y.o. female who presents today for cough and sore throat.   Discussed the use of AI scribe software for clinical note transcription with the patient, who gave verbal consent to proceed.  History of Present Illness   Kelly Eaton is a 28 year old female who presents with a cough, sore throat, and congestion.  She has been experiencing a cough, sore throat, and congestion for approximately two weeks. Initially, she noticed drainage and attributed it to weather changes. 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 The sore throat, which she believes is due to drainage, has worsened over time. The cough started two days ago and has also worsened, with occasional hemoptysis.  She had a virtual consultation and was advised to take Mucinex DM, which she has been using. She was also prescribed albuterol . After taking Mucinex DM and Lexapro  in the morning, she felt strange and consulted a triage nurse, who informed her that Mucinex could increase her heart rate. She ensures adequate hydration while taking Mucinex.  She reports congestion in both her chest and head, but no shortness of breath, fever, headache, nausea, or vomiting. She experiences fatigue and myalgia. She is currently breastfeeding and drinks a significant amount of water daily, supplemented with electrolyte packets like Liquid IV.  Additionally, she has been experiencing constipation for the past five days, with painful and hard stools. She drinks three to four large containers of water daily and uses electrolyte supplements. No abdominal pain.  Her current medications include Mucinex DM, albuterol , and Lexapro . She has previously used an inhaler and notes it makes her feel jittery.      Social History   Tobacco Use  Smoking Status Never  . Passive exposure: Never  Smokeless Tobacco Never     Current Outpatient Medications on File Prior to Visit  Medication Sig Dispense Refill  . albuterol  (VENTOLIN  HFA) 108 (90 Base) MCG/ACT inhaler Inhale 2 puffs into the lungs every 6 (six) hours as needed for wheezing or shortness of breath. 8 g 0  . escitalopram  (LEXAPRO ) 10 MG tablet Take 10 mg by mouth.    . Prenatal Vit-Fe Fumarate-FA (PRENATAL PO) Take by mouth daily.     No current facility-administered medications on file prior to visit.     ROS see history of present illness  Objective  Physical Exam Vitals:   08/20/24 0820  BP: 120/70  Pulse: 82  Temp: 97.8 F (36.6 C)  SpO2: 97%    BP Readings from Last 3 Encounters:  08/20/24 120/70  07/27/24 106/72  06/09/24 125/73   Wt Readings from Last 3 Encounters:  08/20/24 142 lb 6.4 oz (64.6 kg)  07/27/24 144 lb 9.6 oz (65.6 kg)  06/09/24 146 lb 14.4 oz (66.6 kg)    Physical Exam Constitutional:      General: She is not in acute distress.    Appearance: Normal appearance.  HENT:     Head: Normocephalic.     Right Ear: Tympanic membrane normal.     Left Ear: Tympanic membrane normal.     Nose: Congestion present.     Right Sinus: Maxillary sinus tenderness and frontal sinus tenderness present.     Left Sinus: Maxillary sinus tenderness and frontal sinus tenderness present.     Mouth/Throat:     Mouth: Mucous membranes are moist.     Pharynx:  Oropharynx is clear.  Eyes:     Conjunctiva/sclera: Conjunctivae normal.     Pupils: Pupils are equal, round, and reactive to light.  Cardiovascular:     Rate and Rhythm: Normal rate and regular rhythm.     Heart sounds: Normal heart sounds.  Pulmonary:     Effort: Pulmonary effort is normal.     Breath sounds: Normal breath sounds.  Abdominal:     General: Abdomen is flat. Bowel sounds are normal.     Palpations: Abdomen is soft. There is no mass.     Tenderness: There is no abdominal tenderness.  Lymphadenopathy:     Cervical: No cervical adenopathy.   Skin:    General: Skin is warm and dry.  Neurological:     General: No focal deficit present.     Mental Status: She is alert.  Psychiatric:        Mood and Affect: Mood normal.        Behavior: Behavior normal.      Assessment/Plan: Please see individual problem list.  Assessment and Plan    Acute upper respiratory infection with cough and sore throat   Symptoms indicate a bacterial infection, with negative results for COVID-19, influenza, and strep throat. Prescribe antibiotics and continue Mucinex as needed. Use an albuterol  inhaler for shortness of breath or wheezing. Prescribe a cough medicine safe for breastfeeding. Ensure adequate fluid intake.  Constipation   New onset constipation is likely due to medication and illness. Consider Dulcolax or Miralax and ensure adequate fluid intake.       Acute non-recurrent pansinusitis -     Amoxicillin -Pot Clavulanate; Take 1 tablet by mouth 2 (two) times daily.  Dispense: 20 tablet; Refill: 0  Flu-like symptoms -     POC COVID-19 BinaxNow -     POCT Influenza A/B     No follow-ups on file.   Leron Glance, NP-C [facility]

## 2024-09-02 ENCOUNTER — Encounter: Payer: Self-pay | Admitting: Nurse Practitioner

## 2024-09-02 NOTE — Assessment & Plan Note (Signed)
 Symptoms consistent with sinusitis, with negative results for COVID-19 and influenza. Start Augmentin  twice daily and continue Mucinex as needed. Use an albuterol  inhaler for shortness of breath or wheezing. Ensure adequate fluid intake. Return precautions given to patient.

## 2025-01-26 ENCOUNTER — Encounter: Admitting: Internal Medicine
